# Patient Record
Sex: Female | Born: 1985 | Race: Black or African American | Hispanic: No | Marital: Married | Smoking: Never smoker
Health system: Southern US, Community
[De-identification: ages and names within clinical notes are randomized; demographics above are authoritative.]

## PROBLEM LIST (undated history)

## (undated) DIAGNOSIS — J45909 Unspecified asthma, uncomplicated: Secondary | ICD-10-CM

## (undated) HISTORY — PX: NO PAST SURGERIES: SHX2092

## (undated) HISTORY — DX: Unspecified asthma, uncomplicated: J45.909

---

## 2015-06-04 ENCOUNTER — Encounter: Payer: Self-pay | Admitting: Primary Care

## 2015-06-04 ENCOUNTER — Ambulatory Visit (INDEPENDENT_AMBULATORY_CARE_PROVIDER_SITE_OTHER): Payer: BLUE CROSS/BLUE SHIELD | Admitting: Primary Care

## 2015-06-04 ENCOUNTER — Encounter: Payer: Self-pay | Admitting: *Deleted

## 2015-06-04 VITALS — BP 122/76 | HR 81 | Temp 98.2°F | Ht 64.0 in | Wt 171.0 lb

## 2015-06-04 DIAGNOSIS — Z23 Encounter for immunization: Secondary | ICD-10-CM | POA: Diagnosis not present

## 2015-06-04 DIAGNOSIS — Z0000 Encounter for general adult medical examination without abnormal findings: Secondary | ICD-10-CM | POA: Diagnosis not present

## 2015-06-04 LAB — CBC
HCT: 38.8 % (ref 36.0–46.0)
Hemoglobin: 12.8 g/dL (ref 12.0–15.0)
MCHC: 32.9 g/dL (ref 30.0–36.0)
MCV: 84.9 fl (ref 78.0–100.0)
PLATELETS: 291 10*3/uL (ref 150.0–400.0)
RBC: 4.57 Mil/uL (ref 3.87–5.11)
RDW: 13.9 % (ref 11.5–15.5)
WBC: 4.8 10*3/uL (ref 4.0–10.5)

## 2015-06-04 LAB — COMPREHENSIVE METABOLIC PANEL
ALT: 28 U/L (ref 0–35)
AST: 25 U/L (ref 0–37)
Albumin: 4.2 g/dL (ref 3.5–5.2)
Alkaline Phosphatase: 60 U/L (ref 39–117)
BUN: 13 mg/dL (ref 6–23)
CALCIUM: 9.5 mg/dL (ref 8.4–10.5)
CHLORIDE: 104 meq/L (ref 96–112)
CO2: 29 meq/L (ref 19–32)
CREATININE: 0.73 mg/dL (ref 0.40–1.20)
GFR: 121.77 mL/min (ref >60.00)
Glucose, Bld: 95 mg/dL (ref 70–99)
POTASSIUM: 4.4 meq/L (ref 3.5–5.1)
Sodium: 139 mEq/L (ref 135–145)
Total Bilirubin: 0.4 mg/dL (ref 0.2–1.2)
Total Protein: 7.3 g/dL (ref 6.0–8.3)

## 2015-06-04 LAB — LIPID PANEL
CHOLESTEROL: 198 mg/dL (ref 0–200)
HDL: 44.8 mg/dL (ref >39.00)
LDL CALC: 140 mg/dL — AB (ref 0–99)
NonHDL: 153.34
TRIGLYCERIDES: 65 mg/dL (ref 0.0–149.0)
Total CHOL/HDL Ratio: 4
VLDL: 13 mg/dL (ref 0.0–40.0)

## 2015-06-04 LAB — TSH: TSH: 1.06 u[IU]/mL (ref 0.35–4.50)

## 2015-06-04 LAB — HEMOGLOBIN A1C: Hgb A1c MFr Bld: 5.5 % (ref 4.6–6.5)

## 2015-06-04 NOTE — Assessment & Plan Note (Signed)
Td in 2007, provided today.  Pap due in 2018. Exam unremarkable. Labs pending. Discussed the importance of a healthy diet and regular exercise in order for weight loss and to reduce risk of other medical diseases.  Follow up in 1 year for repeat physical.

## 2015-06-04 NOTE — Progress Notes (Signed)
Pre visit review using our clinic review tool, if applicable. No additional management support is needed unless otherwise documented below in the visit note. 

## 2015-06-04 NOTE — Patient Instructions (Signed)
Complete lab work prior to leaving today. I will notify you of your results once received.   It is important that you improve your diet. Please limit carbohydrates in the form of white bread, rice, pasta, sugary drinks, etc. Increase your consumption of fresh fruits and vegetables, whole grains, water.  Ensure you are consuming 64 ounces of water daily.  Start exercising. You should be getting 1 hour of moderate intensity exercise 5 days weekly.  You will be due for your pap in August 2018.  Start taking Prenatal Vitamins to help prepare your body for pregnancy.  Follow up in 1 year for repeat physical with Pap, or sooner if needed.  It was a pleasure to meet you today! Please don't hesitate to call me with any questions. Welcome to [facility]!

## 2015-06-04 NOTE — Progress Notes (Signed)
Subjective:    Patient ID: Sabrina Mills, female    DOB: 04/07/1985, 30 y.o.   MRN: [id number]  HPI  Mr. Cranford is a 30 year old female who presents today to establish care and discuss the problems mentioned below. Will obtain old records.  who presents today for complete physical.  Immunizations: -Tetanus: Completed in 2007.  -Influenza: Did complete last season.   Diet: She endorses a healthy diet. Breakfast: Oatmeal, coffee Lunch: Salad, burger Dinner: Pasta, salad, chicken, vegetables Snacks: Occasionally. Candy, chips, fruit, yogurt. Desserts: Occasionally. Beverages: Water, sweet tea, some sodas  Exercise: She does not currently exercise. Eye exam: Completed in 2014, no changes in vision. Dental exam: Has not completed in several years. Pap Smear: Completed in August 2015, normal.    Review of Systems  Constitutional: Negative for unexpected weight change.  HENT: Negative for rhinorrhea.   Respiratory: Negative for cough and shortness of breath.   Cardiovascular: Negative for chest pain.  Gastrointestinal: Negative for diarrhea and constipation.  Genitourinary: Negative for difficulty urinating and menstrual problem.       Regular periods  Musculoskeletal: Negative for myalgias and arthralgias.  Skin: Negative for rash.  Allergic/Immunologic: Negative for environmental allergies.  Neurological: Negative for dizziness, numbness and headaches.  Psychiatric/Behavioral:       Denies concerns for anxiety and depression       Past Medical History  Diagnosis Date  . Asthma      Social History   Social History  . Marital Status: Married    Spouse Name: N/A  . Number of Children: N/A  . Years of Education: N/A   Occupational History  . Not on file.   Social History Main Topics  . Smoking status: Never Smoker   . Smokeless tobacco: Not on file  . Alcohol Use: No  . Drug Use: No  . Sexual Activity: Not on file   Other Topics Concern  . Not on file    Social History Narrative   Married.   Moved from [place].   Works at [employer].   No children.   Enjoys traveling, cooking, spending time with family.    No past surgical history on file.  Family History  Problem Relation Age of Onset  . Hypertension Father     No Known Allergies  No current outpatient prescriptions on file prior to visit.   No current facility-administered medications on file prior to visit.    BP 122/76 mmHg  Pulse 81  Temp(Src) 98.2 F (36.8 C) (Oral)  Ht  (1.626 m)  Wt 171 lb (77.565 kg)  BMI 29.34 kg/m2  SpO2 98%  LMP 05/29/2015    Objective:   Physical Exam  Constitutional: She is oriented to person, place, and time. She appears well-nourished.  HENT:  Right Ear: Tympanic membrane and ear canal normal.  Left Ear: Tympanic membrane and ear canal normal.  Nose: Nose normal.  Mouth/Throat: Oropharynx is clear and moist.  Eyes: Conjunctivae and EOM are normal. Pupils are equal, round, and reactive to light.  Neck: Neck supple. No thyromegaly present.  Cardiovascular: Normal rate and regular rhythm.   No murmur heard. Pulmonary/Chest: Effort normal and breath sounds normal. She has no rales.  Abdominal: Soft. Bowel sounds are normal. There is no tenderness.  Musculoskeletal: Normal range of motion.  Lymphadenopathy:    She has no cervical adenopathy.  Neurological: She is alert and oriented to person, place, and time. She has normal reflexes. No cranial  nerve deficit.  Skin: Skin is warm and dry. No rash noted.  Psychiatric: She has a normal mood and affect.          Assessment & Plan:

## 2015-06-04 NOTE — Addendum Note (Signed)
Addended by: Tawnya CrookSAMBATH, Bernon Arviso on: 06/04/2015 09:38 AM   Modules accepted: Orders

## 2015-08-31 LAB — OB RESULTS CONSOLE GC/CHLAMYDIA
Chlamydia: NEGATIVE
Gonorrhea: NEGATIVE

## 2015-08-31 LAB — OB RESULTS CONSOLE RUBELLA ANTIBODY, IGM: Rubella: NON-IMMUNE/NOT IMMUNE

## 2015-08-31 LAB — OB RESULTS CONSOLE ANTIBODY SCREEN: Antibody Screen: NEGATIVE

## 2015-08-31 LAB — OB RESULTS CONSOLE ABO/RH: RH TYPE: POSITIVE

## 2015-08-31 LAB — OB RESULTS CONSOLE HIV ANTIBODY (ROUTINE TESTING): HIV: NONREACTIVE

## 2015-08-31 LAB — OB RESULTS CONSOLE HEPATITIS B SURFACE ANTIGEN: Hepatitis B Surface Ag: NEGATIVE

## 2015-08-31 LAB — OB RESULTS CONSOLE RPR: RPR: NONREACTIVE

## 2015-11-25 DIAGNOSIS — Z3A18 18 weeks gestation of pregnancy: Secondary | ICD-10-CM | POA: Diagnosis not present

## 2015-11-25 DIAGNOSIS — Z369 Encounter for antenatal screening, unspecified: Secondary | ICD-10-CM | POA: Diagnosis not present

## 2015-11-25 DIAGNOSIS — N898 Other specified noninflammatory disorders of vagina: Secondary | ICD-10-CM | POA: Diagnosis not present

## 2015-11-25 DIAGNOSIS — Z3491 Encounter for supervision of normal pregnancy, unspecified, first trimester: Secondary | ICD-10-CM | POA: Diagnosis not present

## 2015-12-10 DIAGNOSIS — Z363 Encounter for antenatal screening for malformations: Secondary | ICD-10-CM | POA: Diagnosis not present

## 2015-12-10 DIAGNOSIS — Z3A20 20 weeks gestation of pregnancy: Secondary | ICD-10-CM | POA: Diagnosis not present

## 2015-12-10 DIAGNOSIS — Z3402 Encounter for supervision of normal first pregnancy, second trimester: Secondary | ICD-10-CM | POA: Diagnosis not present

## 2015-12-10 DIAGNOSIS — O3692X1 Maternal care for fetal problem, unspecified, second trimester, fetus 1: Secondary | ICD-10-CM | POA: Diagnosis not present

## 2016-01-07 DIAGNOSIS — Z3A24 24 weeks gestation of pregnancy: Secondary | ICD-10-CM | POA: Diagnosis not present

## 2016-01-07 DIAGNOSIS — O3692X1 Maternal care for fetal problem, unspecified, second trimester, fetus 1: Secondary | ICD-10-CM | POA: Diagnosis not present

## 2016-01-07 DIAGNOSIS — Z3402 Encounter for supervision of normal first pregnancy, second trimester: Secondary | ICD-10-CM | POA: Diagnosis not present

## 2016-01-28 DIAGNOSIS — Z369 Encounter for antenatal screening, unspecified: Secondary | ICD-10-CM | POA: Diagnosis not present

## 2016-02-11 DIAGNOSIS — Z3403 Encounter for supervision of normal first pregnancy, third trimester: Secondary | ICD-10-CM | POA: Diagnosis not present

## 2016-02-11 DIAGNOSIS — Z3A29 29 weeks gestation of pregnancy: Secondary | ICD-10-CM | POA: Diagnosis not present

## 2016-02-23 DIAGNOSIS — Z3493 Encounter for supervision of normal pregnancy, unspecified, third trimester: Secondary | ICD-10-CM | POA: Diagnosis not present

## 2016-02-23 DIAGNOSIS — Z3A30 30 weeks gestation of pregnancy: Secondary | ICD-10-CM | POA: Diagnosis not present

## 2016-03-07 DIAGNOSIS — Z3493 Encounter for supervision of normal pregnancy, unspecified, third trimester: Secondary | ICD-10-CM | POA: Diagnosis not present

## 2016-03-07 DIAGNOSIS — Z3A32 32 weeks gestation of pregnancy: Secondary | ICD-10-CM | POA: Diagnosis not present

## 2016-03-29 DIAGNOSIS — Z3A35 35 weeks gestation of pregnancy: Secondary | ICD-10-CM | POA: Diagnosis not present

## 2016-03-29 DIAGNOSIS — Z3493 Encounter for supervision of normal pregnancy, unspecified, third trimester: Secondary | ICD-10-CM | POA: Diagnosis not present

## 2016-03-29 DIAGNOSIS — Z369 Encounter for antenatal screening, unspecified: Secondary | ICD-10-CM | POA: Diagnosis not present

## 2016-03-29 LAB — OB RESULTS CONSOLE GBS: STREP GROUP B AG: POSITIVE

## 2016-04-15 DIAGNOSIS — Z3403 Encounter for supervision of normal first pregnancy, third trimester: Secondary | ICD-10-CM | POA: Diagnosis not present

## 2016-04-15 DIAGNOSIS — Z3A38 38 weeks gestation of pregnancy: Secondary | ICD-10-CM | POA: Diagnosis not present

## 2016-04-18 DIAGNOSIS — Z3A38 38 weeks gestation of pregnancy: Secondary | ICD-10-CM | POA: Diagnosis not present

## 2016-04-18 DIAGNOSIS — Z3493 Encounter for supervision of normal pregnancy, unspecified, third trimester: Secondary | ICD-10-CM | POA: Diagnosis not present

## 2016-04-18 DIAGNOSIS — R102 Pelvic and perineal pain: Secondary | ICD-10-CM | POA: Diagnosis not present

## 2016-04-27 DIAGNOSIS — Z3493 Encounter for supervision of normal pregnancy, unspecified, third trimester: Secondary | ICD-10-CM | POA: Diagnosis not present

## 2016-04-27 DIAGNOSIS — Z3A40 40 weeks gestation of pregnancy: Secondary | ICD-10-CM | POA: Diagnosis not present

## 2016-04-28 ENCOUNTER — Encounter (HOSPITAL_COMMUNITY): Payer: Self-pay | Admitting: *Deleted

## 2016-04-28 ENCOUNTER — Telehealth (HOSPITAL_COMMUNITY): Payer: Self-pay | Admitting: *Deleted

## 2016-04-28 NOTE — Telephone Encounter (Signed)
Preadmission screen  

## 2016-04-29 DIAGNOSIS — O480 Post-term pregnancy: Secondary | ICD-10-CM | POA: Diagnosis not present

## 2016-04-29 DIAGNOSIS — Z3A40 40 weeks gestation of pregnancy: Secondary | ICD-10-CM | POA: Diagnosis not present

## 2016-05-01 ENCOUNTER — Encounter (HOSPITAL_COMMUNITY): Payer: Self-pay | Admitting: *Deleted

## 2016-05-01 ENCOUNTER — Inpatient Hospital Stay (HOSPITAL_COMMUNITY): Payer: BLUE CROSS/BLUE SHIELD | Admitting: Anesthesiology

## 2016-05-01 ENCOUNTER — Encounter (HOSPITAL_COMMUNITY)
Admission: AD | Disposition: A | Discharge status: Home / Self Care | Payer: Self-pay | Source: Ambulatory Visit | Attending: Obstetrics and Gynecology

## 2016-05-01 ENCOUNTER — Inpatient Hospital Stay (HOSPITAL_COMMUNITY)
Admission: AD | Admit: 2016-05-01 | Discharge: 2016-05-04 | DRG: 775 | Disposition: A | Discharge status: Home / Self Care | Payer: BLUE CROSS/BLUE SHIELD | Source: Ambulatory Visit | Attending: Obstetrics and Gynecology | Admitting: Obstetrics and Gynecology

## 2016-05-01 DIAGNOSIS — O9902 Anemia complicating childbirth: Secondary | ICD-10-CM | POA: Diagnosis not present

## 2016-05-01 DIAGNOSIS — Z8249 Family history of ischemic heart disease and other diseases of the circulatory system: Secondary | ICD-10-CM | POA: Diagnosis not present

## 2016-05-01 DIAGNOSIS — D573 Sickle-cell trait: Secondary | ICD-10-CM | POA: Diagnosis not present

## 2016-05-01 DIAGNOSIS — Z3A40 40 weeks gestation of pregnancy: Secondary | ICD-10-CM | POA: Diagnosis not present

## 2016-05-01 DIAGNOSIS — Z6836 Body mass index (BMI) 36.0-36.9, adult: Secondary | ICD-10-CM

## 2016-05-01 DIAGNOSIS — O99214 Obesity complicating childbirth: Secondary | ICD-10-CM | POA: Diagnosis present

## 2016-05-01 DIAGNOSIS — O139 Gestational [pregnancy-induced] hypertension without significant proteinuria, unspecified trimester: Secondary | ICD-10-CM

## 2016-05-01 DIAGNOSIS — Z3493 Encounter for supervision of normal pregnancy, unspecified, third trimester: Secondary | ICD-10-CM | POA: Diagnosis not present

## 2016-05-01 DIAGNOSIS — Z98891 History of uterine scar from previous surgery: Secondary | ICD-10-CM

## 2016-05-01 DIAGNOSIS — Z23 Encounter for immunization: Secondary | ICD-10-CM | POA: Diagnosis not present

## 2016-05-01 DIAGNOSIS — O99824 Streptococcus B carrier state complicating childbirth: Secondary | ICD-10-CM | POA: Diagnosis present

## 2016-05-01 DIAGNOSIS — O134 Gestational [pregnancy-induced] hypertension without significant proteinuria, complicating childbirth: Secondary | ICD-10-CM | POA: Diagnosis present

## 2016-05-01 DIAGNOSIS — I10 Essential (primary) hypertension: Secondary | ICD-10-CM | POA: Diagnosis not present

## 2016-05-01 LAB — COMPREHENSIVE METABOLIC PANEL
ALT: 11 U/L — AB (ref 14–54)
ANION GAP: 9 (ref 5–15)
AST: 19 U/L (ref 15–41)
Albumin: 3.1 g/dL — ABNORMAL LOW (ref 3.5–5.0)
Alkaline Phosphatase: 158 U/L — ABNORMAL HIGH (ref 38–126)
BUN: 8 mg/dL (ref 6–20)
CALCIUM: 9.1 mg/dL (ref 8.9–10.3)
CHLORIDE: 104 mmol/L (ref 101–111)
CO2: 21 mmol/L — ABNORMAL LOW (ref 22–32)
CREATININE: 0.66 mg/dL (ref 0.44–1.00)
Glucose, Bld: 88 mg/dL (ref 65–99)
Potassium: 4.1 mmol/L (ref 3.5–5.1)
SODIUM: 134 mmol/L — AB (ref 135–145)
Total Bilirubin: 0.8 mg/dL (ref 0.3–1.2)
Total Protein: 6.6 g/dL (ref 6.5–8.1)

## 2016-05-01 LAB — LACTATE DEHYDROGENASE: LDH: 194 U/L — ABNORMAL HIGH (ref 98–192)

## 2016-05-01 LAB — TYPE AND SCREEN
ABO/RH(D): O POS
Antibody Screen: NEGATIVE

## 2016-05-01 LAB — CBC
HCT: 38.6 % (ref 36.0–46.0)
Hemoglobin: 13.5 g/dL (ref 12.0–15.0)
MCH: 29.2 pg (ref 26.0–34.0)
MCHC: 35 g/dL (ref 30.0–36.0)
MCV: 83.5 fL (ref 78.0–100.0)
PLATELETS: 277 10*3/uL (ref 150–400)
RBC: 4.62 MIL/uL (ref 3.87–5.11)
RDW: 15 % (ref 11.5–15.5)
WBC: 8.6 10*3/uL (ref 4.0–10.5)

## 2016-05-01 LAB — URIC ACID: Uric Acid, Serum: 4.4 mg/dL (ref 2.3–6.6)

## 2016-05-01 LAB — PROTEIN / CREATININE RATIO, URINE
CREATININE, URINE: 129 mg/dL
PROTEIN CREATININE RATIO: 0.28 mg/mg{creat} — AB (ref 0.00–0.15)
TOTAL PROTEIN, URINE: 36 mg/dL

## 2016-05-01 LAB — ABO/RH: ABO/RH(D): O POS

## 2016-05-01 SURGERY — Surgical Case
Anesthesia: Epidural

## 2016-05-01 MED ORDER — OXYCODONE-ACETAMINOPHEN 5-325 MG PO TABS: 1.0000 | ORAL_TABLET | ORAL | Status: DC | PRN

## 2016-05-01 MED ORDER — ONDANSETRON HCL 4 MG/2ML IJ SOLN
INTRAMUSCULAR | Status: DC | PRN
  Administered 2016-05-01: 4 mg via INTRAVENOUS

## 2016-05-01 MED ORDER — MORPHINE SULFATE (PF) 0.5 MG/ML IJ SOLN
INTRAMUSCULAR | Status: DC | PRN
  Administered 2016-05-01: .5 mg via INTRAVENOUS
  Administered 2016-05-01: .5 mg via EPIDURAL
  Administered 2016-05-01: 4 mg via EPIDURAL

## 2016-05-01 MED ORDER — SODIUM CHLORIDE 0.9 % IJ SOLN
INTRAMUSCULAR | Status: AC
  Filled 2016-05-01: qty 20

## 2016-05-01 MED ORDER — ONDANSETRON HCL 4 MG/2ML IJ SOLN
INTRAMUSCULAR | Status: AC
  Filled 2016-05-01: qty 4

## 2016-05-01 MED ORDER — PHENYLEPHRINE 40 MCG/ML (10ML) SYRINGE FOR IV PUSH (FOR BLOOD PRESSURE SUPPORT)
80.0000 ug | PREFILLED_SYRINGE | INTRAVENOUS | Status: DC | PRN
  Filled 2016-05-01: qty 10

## 2016-05-01 MED ORDER — PENICILLIN G POT IN DEXTROSE 60000 UNIT/ML IV SOLN
3.0000 10*6.[IU] | INTRAVENOUS | Status: DC
  Administered 2016-05-01 (×2): 3 10*6.[IU] via INTRAVENOUS
  Filled 2016-05-01 (×3): qty 50

## 2016-05-01 MED ORDER — OXYCODONE-ACETAMINOPHEN 5-325 MG PO TABS: 2.0000 | ORAL_TABLET | ORAL | Status: DC | PRN

## 2016-05-01 MED ORDER — SCOPOLAMINE 1 MG/3DAYS TD PT72
MEDICATED_PATCH | TRANSDERMAL | Status: AC
  Filled 2016-05-01: qty 1

## 2016-05-01 MED ORDER — MORPHINE SULFATE (PF) 0.5 MG/ML IJ SOLN
INTRAMUSCULAR | Status: AC
Start: 2016-05-01 — End: 2016-05-01
  Filled 2016-05-01: qty 10

## 2016-05-01 MED ORDER — LABETALOL HCL 5 MG/ML IV SOLN: 20.0000 mg | INTRAVENOUS | Status: DC | PRN

## 2016-05-01 MED ORDER — LACTATED RINGERS IV SOLN
INTRAVENOUS | Status: DC
  Administered 2016-05-01: 16:00:00 via INTRAUTERINE

## 2016-05-01 MED ORDER — OXYTOCIN 40 UNITS IN LACTATED RINGERS INFUSION - SIMPLE MED
2.5000 [IU]/h | INTRAVENOUS | Status: DC
  Filled 2016-05-01: qty 1000

## 2016-05-01 MED ORDER — LACTATED RINGERS IV SOLN
500.0000 mL | Freq: Once | INTRAVENOUS | Status: AC
  Administered 2016-05-01: 500 mL via INTRAVENOUS

## 2016-05-01 MED ORDER — OXYTOCIN 40 UNITS IN LACTATED RINGERS INFUSION - SIMPLE MED
1.0000 m[IU]/min | INTRAVENOUS | Status: DC
  Administered 2016-05-01: 2 m[IU]/min via INTRAVENOUS
  Administered 2016-05-01: 6 m[IU]/min via INTRAVENOUS

## 2016-05-01 MED ORDER — PHENYLEPHRINE 40 MCG/ML (10ML) SYRINGE FOR IV PUSH (FOR BLOOD PRESSURE SUPPORT)
PREFILLED_SYRINGE | INTRAVENOUS | Status: AC
  Filled 2016-05-01: qty 10

## 2016-05-01 MED ORDER — MEPERIDINE HCL 25 MG/ML IJ SOLN
INTRAMUSCULAR | Status: AC
  Filled 2016-05-01: qty 1

## 2016-05-01 MED ORDER — ACETAMINOPHEN 325 MG PO TABS: 650.0000 mg | ORAL_TABLET | ORAL | Status: DC | PRN

## 2016-05-01 MED ORDER — LIDOCAINE-EPINEPHRINE (PF) 2 %-1:200000 IJ SOLN
INTRAMUSCULAR | Status: AC
  Filled 2016-05-01: qty 20

## 2016-05-01 MED ORDER — SOD CITRATE-CITRIC ACID 500-334 MG/5ML PO SOLN
30.0000 mL | ORAL | Status: DC | PRN
Start: 2016-05-01 — End: 2016-05-02
  Administered 2016-05-01: 30 mL via ORAL
  Filled 2016-05-01: qty 15

## 2016-05-01 MED ORDER — HYDRALAZINE HCL 20 MG/ML IJ SOLN: 10.0000 mg | Freq: Once | INTRAMUSCULAR | Status: DC | PRN

## 2016-05-01 MED ORDER — EPHEDRINE 5 MG/ML INJ: 10.0000 mg | INTRAVENOUS | Status: DC | PRN

## 2016-05-01 MED ORDER — OXYTOCIN 10 UNIT/ML IJ SOLN
INTRAMUSCULAR | Status: AC
  Filled 2016-05-01: qty 4

## 2016-05-01 MED ORDER — DIPHENHYDRAMINE HCL 50 MG/ML IJ SOLN: 12.5000 mg | INTRAMUSCULAR | Status: DC | PRN

## 2016-05-01 MED ORDER — SODIUM BICARBONATE 8.4 % IV SOLN
INTRAVENOUS | Status: DC | PRN
  Administered 2016-05-01 (×2): 5 mL via EPIDURAL

## 2016-05-01 MED ORDER — LIDOCAINE HCL (PF) 1 % IJ SOLN
INTRAMUSCULAR | Status: DC | PRN
  Administered 2016-05-01 (×2): 5 mL via EPIDURAL

## 2016-05-01 MED ORDER — ONDANSETRON HCL 4 MG/2ML IJ SOLN: 4.0000 mg | Freq: Four times a day (QID) | INTRAMUSCULAR | Status: DC | PRN

## 2016-05-01 MED ORDER — MEPERIDINE HCL 25 MG/ML IJ SOLN
INTRAMUSCULAR | Status: DC | PRN
  Administered 2016-05-01 (×2): 12.5 mg via INTRAVENOUS

## 2016-05-01 MED ORDER — LIDOCAINE HCL (PF) 1 % IJ SOLN: 30.0000 mL | INTRAMUSCULAR | Status: DC | PRN

## 2016-05-01 MED ORDER — LACTATED RINGERS IV SOLN: 500.0000 mL | INTRAVENOUS | Status: DC | PRN

## 2016-05-01 MED ORDER — OXYTOCIN BOLUS FROM INFUSION: 500.0000 mL | Freq: Once | INTRAVENOUS | Status: DC

## 2016-05-01 MED ORDER — LACTATED RINGERS IV SOLN
INTRAVENOUS | Status: DC
  Administered 2016-05-01 (×2): via INTRAVENOUS

## 2016-05-01 MED ORDER — CEFAZOLIN SODIUM-DEXTROSE 2-4 GM/100ML-% IV SOLN: 2.0000 g | Freq: Once | INTRAVENOUS | Status: DC

## 2016-05-01 MED ORDER — PHENYLEPHRINE 40 MCG/ML (10ML) SYRINGE FOR IV PUSH (FOR BLOOD PRESSURE SUPPORT)
80.0000 ug | PREFILLED_SYRINGE | INTRAVENOUS | Status: DC | PRN
  Administered 2016-05-01: 80 ug via INTRAVENOUS

## 2016-05-01 MED ORDER — FENTANYL 2.5 MCG/ML BUPIVACAINE 1/10 % EPIDURAL INFUSION (WH - ANES)
14.0000 mL/h | INTRAMUSCULAR | Status: DC | PRN
  Administered 2016-05-01: 14 mL/h via EPIDURAL
  Filled 2016-05-01: qty 100

## 2016-05-01 MED ORDER — CEFAZOLIN SODIUM-DEXTROSE 2-3 GM-% IV SOLR
INTRAVENOUS | Status: DC | PRN
  Administered 2016-05-01: 2 g via INTRAVENOUS

## 2016-05-01 MED ORDER — SCOPOLAMINE 1 MG/3DAYS TD PT72
MEDICATED_PATCH | TRANSDERMAL | Status: DC | PRN
  Administered 2016-05-01: 1 via TRANSDERMAL

## 2016-05-01 MED ORDER — PENICILLIN G POTASSIUM 5000000 UNITS IJ SOLR
5.0000 10*6.[IU] | Freq: Once | INTRAVENOUS | Status: AC
  Administered 2016-05-01: 5 10*6.[IU] via INTRAVENOUS
  Filled 2016-05-01: qty 5

## 2016-05-01 MED ORDER — OXYTOCIN 10 UNIT/ML IJ SOLN
INTRAVENOUS | Status: DC | PRN
  Administered 2016-05-01: 40 [IU] via INTRAVENOUS

## 2016-05-01 MED ORDER — TERBUTALINE SULFATE 1 MG/ML IJ SOLN: 0.2500 mg | Freq: Once | INTRAMUSCULAR | Status: DC | PRN

## 2016-05-01 SURGICAL SUPPLY — 38 items
BARRIER ADHS 3X4 INTERCEED (GAUZE/BANDAGES/DRESSINGS) ×2 IMPLANT
BENZOIN TINCTURE PRP APPL 2/3 (GAUZE/BANDAGES/DRESSINGS) ×2 IMPLANT
CHLORAPREP W/TINT 26ML (MISCELLANEOUS) ×2 IMPLANT
CLAMP CORD UMBIL (MISCELLANEOUS) IMPLANT
CLOSURE STERI STRIP 1/2 X4 (GAUZE/BANDAGES/DRESSINGS) ×2 IMPLANT
CLOTH BEACON ORANGE TIMEOUT ST (SAFETY) ×2 IMPLANT
CONTAINER PREFILL 10% NBF 15ML (MISCELLANEOUS) IMPLANT
DRSG OPSITE POSTOP 4X10 (GAUZE/BANDAGES/DRESSINGS) ×2 IMPLANT
ELECT REM PT RETURN 9FT ADLT (ELECTROSURGICAL) ×2
ELECTRODE REM PT RTRN 9FT ADLT (ELECTROSURGICAL) ×1 IMPLANT
EXTRACTOR VACUUM KIWI (MISCELLANEOUS) IMPLANT
GLOVE BIOGEL M 6.5 STRL (GLOVE) ×4 IMPLANT
GLOVE BIOGEL PI IND STRL 6.5 (GLOVE) ×1 IMPLANT
GLOVE BIOGEL PI IND STRL 7.0 (GLOVE) ×1 IMPLANT
GLOVE BIOGEL PI INDICATOR 6.5 (GLOVE) ×1
GLOVE BIOGEL PI INDICATOR 7.0 (GLOVE) ×1
GOWN STRL REUS W/TWL LRG LVL3 (GOWN DISPOSABLE) ×6 IMPLANT
KIT ABG SYR 3ML LUER SLIP (SYRINGE) IMPLANT
NEEDLE HYPO 25X5/8 SAFETYGLIDE (NEEDLE) IMPLANT
NS IRRIG 1000ML POUR BTL (IV SOLUTION) ×2 IMPLANT
PACK C SECTION WH (CUSTOM PROCEDURE TRAY) ×2 IMPLANT
PAD ABD 7.5X8 STRL (GAUZE/BANDAGES/DRESSINGS) ×2 IMPLANT
PAD OB MATERNITY 4.3X12.25 (PERSONAL CARE ITEMS) ×2 IMPLANT
PENCIL SMOKE EVAC W/HOLSTER (ELECTROSURGICAL) ×2 IMPLANT
RTRCTR C-SECT PINK 25CM LRG (MISCELLANEOUS) IMPLANT
STRIP CLOSURE SKIN 1/2X4 (GAUZE/BANDAGES/DRESSINGS) ×2 IMPLANT
SUT MNCRL AB 3-0 PS2 27 (SUTURE) ×2 IMPLANT
SUT PDS AB 0 CT1 27 (SUTURE) ×4 IMPLANT
SUT PLAIN 0 NONE (SUTURE) IMPLANT
SUT VIC AB 0 CTX 36 (SUTURE) ×3
SUT VIC AB 0 CTX36XBRD ANBCTRL (SUTURE) ×3 IMPLANT
SUT VIC AB 2-0 CT1 27 (SUTURE) ×1
SUT VIC AB 2-0 CT1 TAPERPNT 27 (SUTURE) ×1 IMPLANT
SUT VIC AB 3-0 SH 27 (SUTURE)
SUT VIC AB 3-0 SH 27X BRD (SUTURE) IMPLANT
SUT VIC AB 4-0 KS 27 (SUTURE) ×2 IMPLANT
TOWEL OR 17X24 6PK STRL BLUE (TOWEL DISPOSABLE) ×2 IMPLANT
TRAY FOLEY BAG SILVER LF 14FR (SET/KITS/TRAYS/PACK) ×2 IMPLANT

## 2016-05-01 NOTE — Transfer of Care (Signed)
Immediate Anesthesia Transfer of Care Note  Patient: Sabrina Mills  Procedure(s) Performed: Procedure(s): CESAREAN SECTION (N/A)  Patient Location: PACU  Anesthesia Type:Epidural  Level of Consciousness: awake, alert  and oriented  Airway & Oxygen Therapy: Patient Spontanous Breathing  Post-op Assessment: Report given to RN and Post -op Vital signs reviewed and stable  Post vital signs: Reviewed and stable  Last Vitals:  Vitals:   05/01/16 2001 05/01/16 2031  BP: 133/70 (!) 145/93  Pulse: (!) 112 (!) 111  Resp: 18 18  Temp: 37.4 C     Last Pain:  Vitals:   05/01/16 2001  TempSrc: Axillary  PainSc: 0-No pain         Complications: No apparent anesthesia complications

## 2016-05-01 NOTE — MAU Note (Signed)
Pt states she has been contracting since 0845 and they have gotten stronger and closer together, denies LOF/VB.

## 2016-05-01 NOTE — Progress Notes (Signed)
Patient with recurrent late decelerations. Recommend cesarean section. r/b/a reviewed with patient. She agrees to proceed with cesarean section due to fetal intolerance of labor.

## 2016-05-01 NOTE — Anesthesia Preprocedure Evaluation (Signed)
Anesthesia Evaluation  Patient identified by MRN, date of birth, ID band Patient awake    Reviewed: Allergy & Precautions, H&P , NPO status , Patient's Chart, lab work & pertinent test results, reviewed documented beta blocker date and time   Airway Mallampati: II  TM Distance: >3 FB Neck ROM: full    Dental no notable dental hx. (+) Teeth Intact, Dental Advidsory Given   Pulmonary asthma ,    Pulmonary exam normal breath sounds clear to auscultation       Cardiovascular Exercise Tolerance: Good hypertension,  Rhythm:regular Rate:Normal     Neuro/Psych negative neurological ROS  negative psych ROS   GI/Hepatic negative GI ROS, Neg liver ROS,   Endo/Other  Morbid obesity  Renal/GU negative Renal ROS  negative genitourinary   Musculoskeletal   Abdominal   Peds  Hematology negative hematology ROS (+)   Anesthesia Other Findings   Reproductive/Obstetrics negative OB ROS                             Anesthesia Physical Anesthesia Plan  ASA: II  Anesthesia Plan: Epidural   Post-op Pain Management:    Induction:   Airway Management Planned:   Additional Equipment:   Intra-op Plan:   Post-operative Plan:   Informed Consent: I have reviewed the patients History and Physical, chart, labs and discussed the procedure including the risks, benefits and alternatives for the proposed anesthesia with the patient or authorized representative who has indicated his/her understanding and acceptance.   Dental Advisory Given  Plan Discussed with: CRNA  Anesthesia Plan Comments:         Anesthesia Quick Evaluation

## 2016-05-01 NOTE — Progress Notes (Signed)
Subjective: Pt just finished epidural.  Feeling just pressure.  Called to room for fetal heart rate being low.   Objective: BP (!) 146/85   Pulse 88   Temp 98.3 F (36.8 C) (Oral)   Resp 18   Ht  (1.626 m)   Wt 95.7 kg (211 lb)   LMP 07/22/2015   BMI 36.22 kg/m  No intake/output data recorded. No intake/output data recorded.  FHT: Category 2 UC:   irregular, every 2-6 minutes SVE:   Dilation: 3 Effacement (%): 90 Station: -1 Exam by:: Prothero, cnm FHT down to 110s from bl of 150. Multiple long decels.  Scalp applied with out difficulty.  IUPC places.  Amnioinfusion started with 300 cc bolus than 125cc an hour.  Discussed procedures with pt and husband before placing.  Return to BL with position change. Assessment:  G1P0 at 40.4 IUP latent labor Cat 2 strip GBS positive  Plan: Update to Dr. Richardson Dopp Will monitor strip if reassuring will start pitocin augmentation for adequate contractions.  Kenney Houseman CNM, MSN 05/01/2016, 4:49 PM

## 2016-05-01 NOTE — Progress Notes (Signed)
In to assess patient due to reports of prolonged variable and late decelerations. Patient received iV fluid bolus/ Amnioinfusion and position changes prior to my arrival. Upon my arrival FHR baseline 140 with moderate variability accelerations present no decelerations. I discussed with the patient that the FHR was currently reassuring. Will start pitocin for augmentation given reassuring fhr. Will recommend cesarean section if pt has fetal intolerance of labor after pitocin is started. r/b/a of cesarean section were discussed including but not limited to infection, bleeding damage to bowel bladder baby with the need for further surgery. r/o transfusion discussed. Pt voiced understanding.

## 2016-05-01 NOTE — Op Note (Signed)
Cesarean Section Procedure Note  Indications: non-reassuring fetal status  Pre-operative Diagnosis: 40 week 4 day pregnancy.  Post-operative Diagnosis: same  Surgeon: Jessee Avers.   Assistants: Gerrit Heck CNM  Anesthesia: Epidural anesthesia  ASA Class: 2   Procedure Details   The patient was seen in the Holding Room. The risks, benefits, complications, treatment options, and expected outcomes were discussed with the patient.  The patient concurred with the proposed plan, giving informed consent.  The site of surgery properly noted/marked. The patient was taken to Operating Room # 9, identified as Sabrina Mills and the procedure verified as C-Section Delivery. A Time Out was held and the above information confirmed.  After induction of anesthesia, the patient was draped and prepped in the usual sterile manner. A Pfannenstiel incision was made and carried down through the subcutaneous tissue to the fascia. Fascial incision was made and extended transversely. The fascia was separated from the underlying rectus tissue superiorly and inferiorly. The peritoneum was identified and entered. Peritoneal incision was extended longitudinally. The utero-vesical peritoneal reflection was incised transversely and the bladder flap was bluntly freed from the lower uterine segment. A low transverse uterine incision was made. Delivered from cephalic presentation was a  Female with Apgar scores of 8 at one minute and 9 at five minutes. After the umbilical cord was clamped and cut cord blood was obtained for evaluation. The placenta was removed intact and appeared normal. The uterine outline, tubes and ovaries appeared normal. The uterine incision was closed with running locked sutures of O vicryl. A second layer of 0 vicryl was used to imbricate the incision. . Hemostasis was observed. Lavage was carried out until clear.  Peritoneum was closed with 2-0 vicryl The fascia was then reapproximated with running  sutures of 0 pds. The skin was reapproximated with 4-0 vicryl .  Instrument, sponge, and needle counts were correct prior the abdominal closure and at the conclusion of the case.   Findings: Female infant in cephalic occiput posterior presentation.normal fallopian tubes and ovaries   Estimated Blood Loss:  600 mL         Drains: None         Total IV Fluids:   Per anesthesia ml         Specimens: Placenta and Disposition:  Sent to Pathology          Implants: None UOP 200 cc          Complications:  None; patient tolerated the procedure well.         Disposition: PACU - hemodynamically stable.         Condition: stable  Attending Attestation: I performed the procedure.

## 2016-05-01 NOTE — Progress Notes (Signed)
Sabrina Mills MRN: [id number]  Subjective: -Care assumed of 31 y.o. G1P0 at [redacted]w[redacted]d who presents for early labor. Patient under care of [facility] and pregnancy remarkable for Rubella NI, [place] Trait, and GBS positive. Strip Reviewed and abnormal prolonged decelerations noted. In room to assess and meet acquaintance of patient and family.  Patient comfortable with epidural.   Objective: BP (!) 151/95   Pulse (!) 102   Temp 99.3 F (37.4 C) (Axillary)   Resp 18   Ht  (1.626 m)   Wt 95.7 kg (211 lb)   LMP 07/22/2015   BMI 36.22 kg/m  No intake/output data recorded. No intake/output data recorded.  Fetal Monitoring: FHT: 150 bpm, Mod Var, +Prolonged Decel, +Accels UC: Q3-32min,  MVUs    Physical Exam: General appearance: alert, well appearing, and in no distress. Chest: not examined.  Abdominal exam: not examined. Extremities: not examined Skin exam: Warm Dry  Vaginal Exam: SVE:   Dilation: 4 Effacement (%): 90 Station: -2 Exam by:: Jaksen Fiorella,CNM Membranes: AROM x 4hrs Internal Monitors: IUPC in place and FSE found to be dislodged-replaced w/o difficulty  Augmentation/Induction: Pitocin:106mUn/min  Cytotec: None  Assessment:  IUP at 40.4wks Cat II FT  Labor Augmentation  Plan: -FSE replaced and immediate improvement noted in FHT -Position to promote fetal descent and rotation -Pitocin titration as ordered -Dr. Delrae Sawyers updated on patient status -Continue other mgmt as ordered   Valma Cava, CNM 05/01/2016, 8:16 PM

## 2016-05-01 NOTE — Anesthesia Procedure Notes (Signed)
Epidural Patient location during procedure: OB Start time: 05/01/2016 3:26 PM End time: 05/01/2016 3:39 PM  Staffing Anesthesiologist: Heather Roberts Performed: anesthesiologist   Preanesthetic Checklist Completed: patient identified, site marked, pre-op evaluation, timeout performed, IV checked, risks and benefits discussed and monitors and equipment checked  Epidural Patient position: sitting Prep: DuraPrep Patient monitoring: heart rate, cardiac monitor, continuous pulse ox and blood pressure Approach: midline Location: L2-L3 Injection technique: LOR saline  Needle:  Needle type: Tuohy  Needle gauge: 17 G Needle length: 9 cm Needle insertion depth: 7 cm Catheter size: 20 Guage Catheter at skin depth: 11 cm Test dose: negative and Other  Assessment Events: blood not aspirated, injection not painful, no injection resistance and negative IV test  Additional Notes Informed consent obtained prior to proceeding including risk of failure, 1% risk of PDPH, risk of minor discomfort and bruising.  Discussed rare but serious complications including epidural abscess, permanent nerve injury, epidural hematoma.  Discussed alternatives to epidural analgesia and patient desires to proceed.  Timeout performed pre-procedure verifying patient name, procedure, and platelet count.  Patient tolerated procedure well.

## 2016-05-01 NOTE — MAU Note (Signed)
Urine in lab 

## 2016-05-01 NOTE — Consult Note (Addendum)
Neonatology Note:   Attendance at C-section:    I was asked by [facility] to attend this primary C/S at 40 4/7 weeks due to [facility]. The mother is a G1P0 O pos, Rubella NI, GBS positive with sickle trait and mild gestational hypertension. ROM 6 hours prior to delivery, fluid bloody. Mother got Pen G > 4 hours before delivery and was afebrile during labor. Infant vigorous with good spontaneous cry and tone. Cord clamping done at 30 seconds. Needed only minimal bulb suctioning. Ap 9/9. Lungs clear to ausc in DR. A large birthmark is noted on the right abdomen, demarcated along the midline anteriorly, extending around the flank onto the back; it is bluish/slightly darker than surrounding skin. Shown to parents. To CN to care of Pediatrician.   Doretha Sou, MD

## 2016-05-01 NOTE — H&P (Signed)
Sabrina Mills is a 31 y.o. female, G1P0 at 40.2 weeks, presenting for contractions since 0845 this am.  Denies leaking of fluid. FM+  BP increased on admission.  Denies headache or blurred vision.  Prenatal hx remarkable for Rubella non-immune, sickle cell trait positive, FOB not tested, GBS positive.  Patient Active Problem List   Diagnosis Date Noted  . Normal labor 05/01/2016  . Gestational hypertension affecting first pregnancy 05/01/2016  . Preventative health care 06/04/2015    History of present pregnancy: Patient entered care at 5.5 weeks.   EDC of 04/27/2016 was established by LMP.   Anatomy scan:  20 weeks, with normal findings and an posterior placenta.   Additional [place] evaluations:  Follow up for unseen anatomy at 24 weeks.     Last evaluation:  04/29/2016  OB History    Gravida Para Term Preterm AB Living   1             SAB TAB Ectopic Multiple Live Births                 Past Medical History:  Diagnosis Date  . Asthma    Past Surgical History:  Procedure Laterality Date  . NO PAST SURGERIES     Family History: family history includes Hypertension in her father. Social History:  reports that she has never smoked. She has never used smokeless tobacco. She reports that she does not drink alcohol or use drugs.   Prenatal Transfer Tool  Maternal Diabetes: No Genetic Screening: Normal Maternal Ultrasounds/Referrals: Normal Fetal Ultrasounds or other Referrals:  None Maternal Substance Abuse:  No Significant Maternal Medications:  None Significant Maternal Lab Results: Lab values include: Group B Strep positive, Other: SST positive, Rubella nonimmune  TDAP Yes Flu Yes  ROS:  All 10 systems reviewed and negative except as stated above  No Known Allergies   Dilation: 3 Effacement (%): 80 Station: -1 Exam by:: A. Gagliardo, RN Blood pressure (!) 152/96, pulse 84, resp. rate 16, last menstrual period 07/22/2015.  Chest clear Heart RRR without murmur Abd  gravid, NT, FH appropriate Pelvic: per RN above Ext: +2/+2  FHR: Category 1 UCs:  2-3 in 10 minutes  Prenatal labs: ABO, Rh: O/Positive/-- (08/28 0000) Antibody: Negative (08/28 0000) Rubella:  Non-immune RPR: Nonreactive (08/28 0000)  HBsAg: Negative (08/28 0000)  HIV: Non-reactive (08/28 0000)  GBS: Positive (03/27 0000) Sickle cell/Hgb electrophoresis:  Postive Pap:  09/2015 Negative GC:  Neg Chlamydia:  Neg Genetic screenings:  Neg Glucola:  116 Other:   Hgb 13.2 at NOB, 11.3 at 28 weeks       Assessment/Plan: IUP at 40.2 in active labor Cat 1 strip Gestational Hypertension GBS positive SST positive Rubella non-immune  Plan: Admit to Birthing Suite per consult with Dr. Richardson Dopp Routine [facility] orders Pain med/epidural prn PCN G for GBS prophylaxis  Pre eclampsia labs with PCR  Henderson Newcomer ProtheroCNM, MSN 05/01/2016, 11:15 AM    2

## 2016-05-01 NOTE — Anesthesia Pain Management Evaluation Note (Signed)
  CRNA Pain Management Visit Note  Patient: Sabrina Mills, 31 y.o., female  "Hello I am a member of the anesthesia team at [facility]. We have an anesthesia team available at all times to provide care throughout the hospital, including epidural management and anesthesia for C-section. I don't know your plan for the delivery whether it a natural birth, water birth, IV sedation, nitrous supplementation, doula or epidural, but we want to meet your pain goals."   1.Was your pain managed to your expectations on prior hospitalizations?   No prior hospitalizations  2.What is your expectation for pain management during this hospitalization?     Epidural  3.How can we help you reach that goal? Epidural in place at time of visit  Record the patient's initial score and the patient's pain goal.   Pain: 0  Pain Goal: 10 [facility] wants you to be able to say your pain was always managed very well.  Rica Records 05/01/2016

## 2016-05-02 ENCOUNTER — Encounter (HOSPITAL_COMMUNITY): Payer: Self-pay | Admitting: Obstetrics and Gynecology

## 2016-05-02 LAB — CBC
HEMATOCRIT: 34.1 % — AB (ref 36.0–46.0)
HEMOGLOBIN: 12 g/dL (ref 12.0–15.0)
MCH: 29.1 pg (ref 26.0–34.0)
MCHC: 35.2 g/dL (ref 30.0–36.0)
MCV: 82.6 fL (ref 78.0–100.0)
Platelets: 265 10*3/uL (ref 150–400)
RBC: 4.13 MIL/uL (ref 3.87–5.11)
RDW: 15.2 % (ref 11.5–15.5)
WBC: 11.8 10*3/uL — ABNORMAL HIGH (ref 4.0–10.5)

## 2016-05-02 LAB — RPR: RPR Ser Ql: NONREACTIVE

## 2016-05-02 MED ORDER — KETOROLAC TROMETHAMINE 30 MG/ML IJ SOLN
30.0000 mg | Freq: Once | INTRAMUSCULAR | Status: AC
Start: 2016-05-02 — End: 2016-05-02
  Administered 2016-05-02: 30 mg via INTRAMUSCULAR

## 2016-05-02 MED ORDER — PRENATAL MULTIVITAMIN CH
1.0000 | ORAL_TABLET | Freq: Every day | ORAL | Status: DC
  Administered 2016-05-02 – 2016-05-04 (×3): 1 via ORAL
  Filled 2016-05-02 (×3): qty 1

## 2016-05-02 MED ORDER — ZOLPIDEM TARTRATE 5 MG PO TABS: 5.0000 mg | ORAL_TABLET | Freq: Every evening | ORAL | Status: DC | PRN

## 2016-05-02 MED ORDER — WITCH HAZEL-GLYCERIN EX PADS: 1.0000 "application " | MEDICATED_PAD | CUTANEOUS | Status: DC | PRN

## 2016-05-02 MED ORDER — SENNOSIDES-DOCUSATE SODIUM 8.6-50 MG PO TABS
2.0000 | ORAL_TABLET | ORAL | Status: DC
  Administered 2016-05-03 (×2): 2 via ORAL
  Filled 2016-05-02 (×2): qty 2

## 2016-05-02 MED ORDER — NIFEDIPINE ER OSMOTIC RELEASE 30 MG PO TB24
30.0000 mg | ORAL_TABLET | Freq: Every day | ORAL | Status: DC
  Administered 2016-05-03 – 2016-05-04 (×2): 30 mg via ORAL
  Filled 2016-05-02 (×2): qty 1

## 2016-05-02 MED ORDER — IBUPROFEN 600 MG PO TABS
600.0000 mg | ORAL_TABLET | Freq: Four times a day (QID) | ORAL | Status: DC
  Administered 2016-05-02 – 2016-05-04 (×10): 600 mg via ORAL
  Filled 2016-05-02 (×10): qty 1

## 2016-05-02 MED ORDER — SIMETHICONE 80 MG PO CHEW: 80.0000 mg | CHEWABLE_TABLET | ORAL | Status: DC | PRN

## 2016-05-02 MED ORDER — MENTHOL 3 MG MT LOZG: 1.0000 | LOZENGE | OROMUCOSAL | Status: DC | PRN

## 2016-05-02 MED ORDER — SIMETHICONE 80 MG PO CHEW
80.0000 mg | CHEWABLE_TABLET | ORAL | Status: DC
  Administered 2016-05-03 (×2): 80 mg via ORAL
  Filled 2016-05-02: qty 1

## 2016-05-02 MED ORDER — TETANUS-DIPHTH-ACELL PERTUSSIS 5-2.5-18.5 LF-MCG/0.5 IM SUSP: 0.5000 mL | Freq: Once | INTRAMUSCULAR | Status: DC

## 2016-05-02 MED ORDER — SIMETHICONE 80 MG PO CHEW
80.0000 mg | CHEWABLE_TABLET | Freq: Three times a day (TID) | ORAL | Status: DC
  Administered 2016-05-02 – 2016-05-04 (×7): 80 mg via ORAL
  Filled 2016-05-02 (×7): qty 1

## 2016-05-02 MED ORDER — DIPHENHYDRAMINE HCL 25 MG PO CAPS: 25.0000 mg | ORAL_CAPSULE | Freq: Four times a day (QID) | ORAL | Status: DC | PRN

## 2016-05-02 MED ORDER — ACETAMINOPHEN 325 MG PO TABS
650.0000 mg | ORAL_TABLET | ORAL | Status: DC | PRN
  Administered 2016-05-03: 650 mg via ORAL
  Filled 2016-05-02: qty 2

## 2016-05-02 MED ORDER — LACTATED RINGERS IV SOLN
INTRAVENOUS | Status: DC
  Administered 2016-05-02: 07:00:00 via INTRAVENOUS

## 2016-05-02 MED ORDER — COCONUT OIL OIL: 1.0000 "application " | TOPICAL_OIL | Status: DC | PRN

## 2016-05-02 MED ORDER — DIBUCAINE 1 % RE OINT: 1.0000 "application " | TOPICAL_OINTMENT | RECTAL | Status: DC | PRN

## 2016-05-02 MED ORDER — KETOROLAC TROMETHAMINE 30 MG/ML IJ SOLN
INTRAMUSCULAR | Status: AC
  Filled 2016-05-02: qty 1

## 2016-05-02 MED ORDER — OXYTOCIN 40 UNITS IN LACTATED RINGERS INFUSION - SIMPLE MED: 2.5000 [IU]/h | INTRAVENOUS | Status: AC

## 2016-05-02 NOTE — Progress Notes (Signed)
Subjective: Postpartum Day 1: Cesarean Delivery Patient reports tolerating PO and + flatus.    Objective: Vital signs in last 24 hours: Temp:  [98 F (36.7 C)-99.6 F (37.6 C)] 98 F (36.7 C) (04/30 0834) Pulse Rate:  [70-113] 85 (04/30 0834) Resp:  [14-28] 18 (04/30 0834) BP: (120-169)/(61-102) 120/71 (04/30 0834) SpO2:  [94 %-99 %] 94 % (04/30 0834)  Physical Exam:  General: alert and cooperative Lochia: appropriate Uterine Fundus: firm Incision: healing well, no significant drainage DVT Evaluation: No evidence of DVT seen on physical exam.   Recent Labs  05/01/16 1132 05/02/16 0553  HGB 13.5 12.0  HCT 38.6 34.1*    Assessment/Plan: Status post Cesarean section. Postoperative course complicated by gestational HTN BPS are elevated will start procardia.  Pt is asymptomatic  Continue current care.  Avery Eustice A 05/02/2016, 12:57 PM

## 2016-05-02 NOTE — Lactation Note (Signed)
This note was copied from a baby's chart. Lactation Consultation Note  Patient Name: Sabrina Mills [id number] Date: 05/02/2016 Reason for consult: Initial assessment Breastfeeding consultation services and support information given and reviewed.  This is mom's first baby and newborn is 31 hours old.  Mom states baby has been to the breast frequently and without difficulty.  Instructed to feed with any feeding cue and call for assist/concerns prn.  Maternal Data Has patient been taught Hand Expression?: Yes Does the patient have breastfeeding experience prior to this delivery?: No  Feeding Feeding Type: Breast Fed Length of feed: 10 min  LATCH Score/Interventions                      Lactation Tools Discussed/Used     Consult Status Consult Status: PRN    Huston Foley 05/02/2016, 11:52 AM

## 2016-05-02 NOTE — Anesthesia Postprocedure Evaluation (Addendum)
Anesthesia Post Note  Patient: Sabrina Mills  Procedure(s) Performed: Procedure(s) (LRB): CESAREAN SECTION (N/A)  Patient location during evaluation: Mother Baby Anesthesia Type: Epidural Level of consciousness: awake Pain management: pain level controlled Vital Signs Assessment: post-procedure vital signs reviewed and stable Respiratory status: spontaneous breathing Cardiovascular status: stable Postop Assessment: no headache, no backache, epidural receding and patient able to bend at knees Anesthetic complications: no        Last Vitals:  Vitals:   05/02/16 0340 05/02/16 0500  BP: (!) 144/90   Pulse: 98   Resp: 18 18  Temp: 37.1 C 37.1 C    Last Pain:  Vitals:   05/02/16 0643  TempSrc:   PainSc: 0-No pain   Pain Goal: Patients Stated Pain Goal: 3 (05/02/16 0500)               Edison Pace

## 2016-05-03 ENCOUNTER — Encounter (HOSPITAL_COMMUNITY): Payer: Self-pay | Admitting: *Deleted

## 2016-05-03 NOTE — Progress Notes (Signed)
  Subjective: Postpartum Day 2: Cesarean Delivery fetal intolerance to labor Patient reports tolerating PO, + flatus and no problems voiding.  Ambulating, Breastfeeding going well.    Objective: Vital signs in last 24 hours: Temp:  [98 F (36.7 C)-98.5 F (36.9 C)] 98.3 F (36.8 C) (05/01 0553) Pulse Rate:  [78-100] 92 (05/01 0553) Resp:  [17-18] 18 (05/01 0553) BP: (120-147)/(71-86) 131/82 (05/01 0553) SpO2:  [94 %-98 %] 98 % (05/01 0000)  Physical Exam:  General: alert, cooperative and no distress  Abdomen:  Slightly distended, BS+ Lochia: appropriate Uterine Fundus: firm Incision: healing well DVT Evaluation: No evidence of DVT seen on physical exam. Negative Homan's sign.   Recent Labs  05/01/16 1132 05/02/16 0553  HGB 13.5 12.0  HCT 38.6 34.1*    Assessment/Plan: Status post Cesarean section. Doing well postoperatively.  Continue current care.  On Procardia with BP better.  Plan on discharge tomorrow.    Henderson Newcomer Mylisa Brunson 05/03/2016, 8:29 AM

## 2016-05-03 NOTE — Lactation Note (Signed)
This note was copied from a baby's chart. Lactation Consultation Note  Patient Name: Sabrina Mills Date: 05/03/2016   Visited with Mom, baby 80 hrs old. 5.3% weight loss. Output adequate, 7 stools, and 2 voids last 24 hrs. Mom stated baby had just fed on and off for 3 hrs.  Baby dressed and swaddled in football hold, sleeping.  Talked about importance and benefits of STS.  Mom stated she sometimes feels a biting sensation.  Undressed baby and unwrapped from swaddle and assisted with positioning baby STS.  Mom has semi-large nipples (no trauma noted), and baby is 5 lbs 12 oz.  Talked about importance of baby latching deeply onto areola.  Mom stated she did hear intermittent swallowing. Baby noted to be a little jittery.  RN aware.  Placed baby STS on Mom's chest.   Mom to call for assistance and assessment at next feeding.   Judee Clara 05/03/2016, 10:43 AM

## 2016-05-04 ENCOUNTER — Inpatient Hospital Stay (HOSPITAL_COMMUNITY): Admission: RE | Admit: 2016-05-04 | Payer: BLUE CROSS/BLUE SHIELD | Source: Ambulatory Visit

## 2016-05-04 MED ORDER — NIFEDIPINE ER 30 MG PO TB24: 30.0000 mg | ORAL_TABLET | Freq: Every day | ORAL | 4 refills | Status: DC

## 2016-05-04 MED ORDER — IBUPROFEN 600 MG PO TABS: 600.0000 mg | ORAL_TABLET | Freq: Four times a day (QID) | ORAL | 0 refills | Status: DC

## 2016-05-04 MED ORDER — OXYCODONE-ACETAMINOPHEN 5-325 MG PO TABS: 1.0000 | ORAL_TABLET | Freq: Four times a day (QID) | ORAL | 0 refills | Status: DC | PRN

## 2016-05-04 MED ORDER — OXYCODONE-ACETAMINOPHEN 5-325 MG PO TABS
1.0000 | ORAL_TABLET | Freq: Four times a day (QID) | ORAL | Status: DC | PRN
  Administered 2016-05-04: 2 via ORAL
  Filled 2016-05-04: qty 2

## 2016-05-04 MED ORDER — MEASLES, MUMPS & RUBELLA VAC ~~LOC~~ INJ
0.5000 mL | INJECTION | Freq: Once | SUBCUTANEOUS | Status: AC
  Administered 2016-05-04: 0.5 mL via SUBCUTANEOUS
  Filled 2016-05-04 (×3): qty 0.5

## 2016-05-04 NOTE — Lactation Note (Signed)
This note was copied from a baby's chart. Lactation Consultation Note  Baby 12 hours old.  < 6 lbs.  8.2% weight loss. Mother stated she did not know how to hand express. Reviewed hand expression and mother easily expressed transitional breastmilk. Baby latched in football position and took a few minutes to get into a rhythmical pattern but then sucks and swallows were noted. Encouraged mother to post pump 4-6 times a day for 10-15 min and give baby back volume pumped in addition to breastfeeding to help stabilize weight loss. Provided mother w/ hand pump and reviewed milk storage. Pacifier use not recommended at this time.  Mom encouraged to feed baby 8-12 times/24 hours and with feeding cues at least q 3 hours. Mother pumped approx 5 ml with manual pump and it was given to baby after feeding w/ slow flow nipple.  Suggest she could also use a spoon. Reviewed engorgement care and monitoring voids/stools. Mother has DEBP at home. Suggest she call if she has additional questions.    Patient Name: Boy Samina Weekes VHQIO'N Date: 05/04/2016 Reason for consult: Follow-up assessment   Maternal Data    Feeding Feeding Type: Breast Fed Length of feed: 15 min  LATCH Score/Interventions Latch: Repeated attempts needed to sustain latch, nipple held in mouth throughout feeding, stimulation needed to elicit sucking reflex.  Audible Swallowing: A few with stimulation  Type of Nipple: Everted at rest and after stimulation  Comfort (Breast/Nipple): Soft / non-tender     Hold (Positioning): No assistance needed to correctly position infant at breast.  LATCH Score: 8  Lactation Tools Discussed/Used     Consult Status Consult Status: Complete    Hardie Pulley 05/04/2016, 9:27 AM

## 2016-05-04 NOTE — Discharge Instructions (Signed)
Postpartum Depression and Baby Blues The postpartum period begins right after the birth of a baby. During this time, there is often a great amount of joy and excitement. It is also a time of many changes in the life of the parents. Regardless of how many times a mother gives birth, each child brings new challenges and dynamics to the family. It is not unusual to have feelings of excitement along with confusing shifts in moods, emotions, and thoughts. All mothers are at risk of developing postpartum depression or the "baby blues." These mood changes can occur right after giving birth, or they may occur many months after giving birth. The baby blues or postpartum depression can be mild or severe. Additionally, postpartum depression can go away rather quickly, or it can be a long-term condition. What are the causes? Raised hormone levels and the rapid drop in those levels are thought to be a main cause of postpartum depression and the baby blues. A number of hormones change during and after pregnancy. Estrogen and progesterone usually decrease right after the delivery of your baby. The levels of thyroid hormone and various cortisol steroids also rapidly drop. Other factors that play a role in these mood changes include major life events and genetics. What increases the risk? If you have any of the following risks for the baby blues or postpartum depression, know what symptoms to watch out for during the postpartum period. Risk factors that may increase the likelihood of getting the baby blues or postpartum depression include:  Having a personal or family history of depression.  Having depression while being pregnant.  Having premenstrual mood issues or mood issues related to oral contraceptives.  Having a lot of life stress.  Having marital conflict.  Lacking a social support network.  Having a baby with special needs.  Having health problems, such as diabetes.  What are the signs or  symptoms? Symptoms of baby blues include:  Brief changes in mood, such as going from extreme happiness to sadness.  Decreased concentration.  Difficulty sleeping.  Crying spells, tearfulness.  Irritability.  Anxiety.  Symptoms of postpartum depression typically begin within the first month after giving birth. These symptoms include:  Difficulty sleeping or excessive sleepiness.  Marked weight loss.  Agitation.  Feelings of worthlessness.  Lack of interest in activity or food.  Postpartum psychosis is a very serious condition and can be dangerous. Fortunately, it is rare. Displaying any of the following symptoms is cause for immediate medical attention. Symptoms of postpartum psychosis include:  Hallucinations and delusions.  Bizarre or disorganized behavior.  Confusion or disorientation.  How is this diagnosed? A diagnosis is made by an evaluation of your symptoms. There are no medical or lab tests that lead to a diagnosis, but there are various questionnaires that a health care provider may use to identify those with the baby blues, postpartum depression, or psychosis. Often, a screening tool called the Edinburgh Postnatal Depression Scale is used to diagnose depression in the postpartum period. How is this treated? The baby blues usually goes away on its own in 1-2 weeks. Social support is often all that is needed. You will be encouraged to get adequate sleep and rest. Occasionally, you may be given medicines to help you sleep. Postpartum depression requires treatment because it can last several months or longer if it is not treated. Treatment may include individual or group therapy, medicine, or both to address any social, physiological, and psychological factors that may play a role in the   depression. Regular exercise, a healthy diet, rest, and social support may also be strongly recommended. Postpartum psychosis is more serious and needs treatment right away.  Hospitalization is often needed. Follow these instructions at home:  Get as much rest as you can. Nap when the baby sleeps.  Exercise regularly. Some women find yoga and walking to be beneficial.  Eat a balanced and nourishing diet.  Do little things that you enjoy. Have a cup of tea, take a bubble bath, read your favorite magazine, or listen to your favorite music.  Avoid alcohol.  Ask for help with household chores, cooking, grocery shopping, or running errands as needed. Do not try to do everything.  Talk to people close to you about how you are feeling. Get support from your partner, family members, friends, or other new moms.  Try to stay positive in how you think. Think about the things you are grateful for.  Do not spend a lot of time alone.  Only take over-the-counter or prescription medicine as directed by your health care provider.  Keep all your postpartum appointments.  Let your health care provider know if you have any concerns. Contact a health care provider if: You are having a reaction to or problems with your medicine. Get help right away if:  You have suicidal feelings.  You think you may harm the baby or someone else. This information is not intended to replace advice given to you by your health care provider. Make sure you discuss any questions you have with your health care provider. Document Released: 09/24/2003 Document Revised: 05/28/2015 Document Reviewed: 10/01/2012 Elsevier Interactive Patient Education  2017 [employer] Inc[employer]  

## 2016-05-04 NOTE — Discharge Summary (Signed)
OB Discharge Summary     Patient Name: Sabrina Mills DOB: 1985/10/11 MRN: [id number]  Date of admission: 05/01/2016 Delivering MD: Gerald Leitz   Date of discharge: 05/04/2016  Admitting diagnosis: 40wks contractions or less Intrauterine pregnancy: [redacted]w[redacted]d     Secondary diagnosis:  Principal Problem:   S/P cesarean section Active Problems:   Normal labor   Gestational hypertension affecting first pregnancy  Additional problems: Rubella NI, Rodman Trait     Discharge diagnosis: Term Pregnancy Delivered, Gestational Hypertension and S/P Primary C/S                                                                                                Post partum procedures:None  Augmentation: AROM and Pitocin  Complications: None  Hospital course:  Onset of Labor With Unplanned C/S  31 y.o. yo G1P1001 at [redacted]w[redacted]d was admitted in Latent Labor on 05/01/2016. Patient had a labor course significant for pitocin augmentation and AROM. Membrane Rupture Time/Date: 4:01 PM ,05/01/2016   The patient went for cesarean section due to Non-Reassuring FHR, and delivered a Viable infant,05/01/2016  Details of operation can be found in separate operative note. Patient had an uncomplicated postpartum course.  She is ambulating,tolerating a regular diet, passing flatus, and urinating well.  Patient is discharged home in stable condition 05/04/16.  Physical exam  Vitals:   05/03/16 0856 05/03/16 0900 05/03/16 1100 05/03/16 1900  BP: (!) 143/96 (!) 142/87 135/76 (!) 147/84  Pulse: (!) 111 (!) 110 100 (!) 110  Resp: 18   18  Temp: 98.5 F (36.9 C)   98.7 F (37.1 C)  TempSrc: Oral   Oral  SpO2: 98%   98%  Weight:      Height:       General: alert, cooperative and no distress  Mood/Affect: Labile Chest: HRRR, CTA Lochia: appropriate Uterine Fundus: firm, Appropriately Tender Incision: Healing well with no significant drainage, Honeycomb dressing to be replaced DVT Evaluation: Calf/Ankle edema is  present  Labs: Lab Results  Component Value Date   WBC 11.8 (H) 05/02/2016   HGB 12.0 05/02/2016   HCT 34.1 (L) 05/02/2016   MCV 82.6 05/02/2016   PLT 265 05/02/2016   CMP Latest Ref Rng & Units 05/01/2016  Glucose 65 - 99 mg/dL 88  BUN 6 - 20 mg/dL 8  Creatinine 2.72 - 5.36 mg/dL 6.44  Sodium 034 - 742 mmol/L 134(L)  Potassium 3.5 - 5.1 mmol/L 4.1  Chloride 101 - 111 mmol/L 104  CO2 22 - 32 mmol/L 21(L)  Calcium 8.9 - 10.3 mg/dL 9.1  Total Protein 6.5 - 8.1 g/dL 6.6  Total Bilirubin 0.3 - 1.2 mg/dL 0.8  Alkaline Phos 38 - 126 U/L 158(H)  AST 15 - 41 U/L 19  ALT 14 - 54 U/L 11(L)    Discharge instruction: per After Visit Summary and "Baby and Me Booklet". Remove steri-strips in 7-10 days.  Incision care guidelines: how to clean, when to call, and anticipated healing. Incision care, Postpartum Depression, Activity Restrictions, Breastfeeding, Pain Mgmt, Who and when to call for PP Concerns, Follow Up Appointment, Information  Sheet Given PPD and BB.  After visit meds:  Allergies as of 05/04/2016   No Known Allergies     Medication List    TAKE these medications   ibuprofen 600 MG tablet Commonly known as:  ADVIL,MOTRIN Take 1 tablet (600 mg total) by mouth every 6 (six) hours. What changed:  medication strength  how much to take  when to take this  reasons to take this   NIFEdipine 30 MG 24 hr tablet Commonly known as:  PROCARDIA-XL/ADALAT CC Take 1 tablet (30 mg total) by mouth daily.   prenatal multivitamin Tabs tablet Take 1 tablet by mouth daily at 12 noon.       Diet: routine diet  Activity: Advance as tolerated. Pelvic rest for 6 weeks.   Outpatient follow up:1wk for BP Check Follow up Appt:No future appointments. Follow up Visit:No Follow-up on file.  Postpartum contraception: Not Discussed  Newborn Data: Live born female  Birth Weight: 6 lb 1.5 oz (2764 g) APGAR: 9, 9  Baby Feeding: Breast Disposition:home with mother   05/04/2016 Cherre Robins, CNM

## 2016-05-11 DIAGNOSIS — I10 Essential (primary) hypertension: Secondary | ICD-10-CM | POA: Diagnosis not present

## 2016-06-04 NOTE — Addendum Note (Signed)
Addendum  created 06/04/16 0901 by Chinonso Linker, MD   Sign clinical note    

## 2016-09-13 DIAGNOSIS — Z01419 Encounter for gynecological examination (general) (routine) without abnormal findings: Secondary | ICD-10-CM | POA: Diagnosis not present

## 2016-09-13 DIAGNOSIS — Z3040 Encounter for surveillance of contraceptives, unspecified: Secondary | ICD-10-CM | POA: Diagnosis not present

## 2016-09-13 DIAGNOSIS — Z6828 Body mass index (BMI) 28.0-28.9, adult: Secondary | ICD-10-CM | POA: Diagnosis not present

## 2016-10-04 DIAGNOSIS — Z23 Encounter for immunization: Secondary | ICD-10-CM | POA: Diagnosis not present

## 2017-05-22 DIAGNOSIS — F4322 Adjustment disorder with anxiety: Secondary | ICD-10-CM | POA: Diagnosis not present

## 2017-06-05 DIAGNOSIS — F4322 Adjustment disorder with anxiety: Secondary | ICD-10-CM | POA: Diagnosis not present

## 2017-08-31 ENCOUNTER — Emergency Department (HOSPITAL_COMMUNITY): Payer: BLUE CROSS/BLUE SHIELD

## 2017-08-31 ENCOUNTER — Encounter (HOSPITAL_COMMUNITY): Payer: Self-pay | Admitting: *Deleted

## 2017-08-31 ENCOUNTER — Other Ambulatory Visit: Payer: Self-pay

## 2017-08-31 ENCOUNTER — Emergency Department (HOSPITAL_COMMUNITY)
Admission: EM | Admit: 2017-08-31 | Discharge: 2017-08-31 | Disposition: A | Discharge status: Home / Self Care | Payer: BLUE CROSS/BLUE SHIELD | Attending: Emergency Medicine | Admitting: Emergency Medicine

## 2017-08-31 DIAGNOSIS — R079 Chest pain, unspecified: Secondary | ICD-10-CM | POA: Insufficient documentation

## 2017-08-31 DIAGNOSIS — Z5321 Procedure and treatment not carried out due to patient leaving prior to being seen by health care provider: Secondary | ICD-10-CM | POA: Diagnosis not present

## 2017-08-31 LAB — CBC
HCT: 39.2 % (ref 36.0–46.0)
HEMOGLOBIN: 12.9 g/dL (ref 12.0–15.0)
MCH: 27.6 pg (ref 26.0–34.0)
MCHC: 32.9 g/dL (ref 30.0–36.0)
MCV: 83.8 fL (ref 78.0–100.0)
PLATELETS: 283 10*3/uL (ref 150–400)
RBC: 4.68 MIL/uL (ref 3.87–5.11)
RDW: 13.8 % (ref 11.5–15.5)
WBC: 5.6 10*3/uL (ref 4.0–10.5)

## 2017-08-31 LAB — BASIC METABOLIC PANEL
ANION GAP: 10 (ref 5–15)
BUN: 10 mg/dL (ref 6–20)
CALCIUM: 9.3 mg/dL (ref 8.9–10.3)
CO2: 23 mmol/L (ref 22–32)
CREATININE: 0.74 mg/dL (ref 0.44–1.00)
Chloride: 105 mmol/L (ref 98–111)
GFR calc Af Amer: >60 mL/min (ref >60)
GLUCOSE: 95 mg/dL (ref 70–99)
Potassium: 4.1 mmol/L (ref 3.5–5.1)
Sodium: 138 mmol/L (ref 135–145)

## 2017-08-31 LAB — I-STAT BETA HCG BLOOD, ED (MC, WL, AP ONLY): I-stat hCG, quantitative: <5 m[IU]/mL (ref <5)

## 2017-08-31 LAB — I-STAT TROPONIN, ED: TROPONIN I, POC: 0 ng/mL (ref 0.00–0.08)

## 2017-08-31 NOTE — ED Notes (Signed)
Pt states she was supposed to see a doctor. Out at nurses station states she just wants to leave and knows everything is normal. Informed she should wait for the provider but patient states she is just going to leave

## 2017-08-31 NOTE — ED Triage Notes (Signed)
Pt in c/o chest pain for the last week, worse this morning, pain is worse with inspiration or movement, denies shortness of breath or n/v, no distress noted

## 2017-09-13 DIAGNOSIS — Z01419 Encounter for gynecological examination (general) (routine) without abnormal findings: Secondary | ICD-10-CM | POA: Diagnosis not present

## 2017-09-13 DIAGNOSIS — Z6828 Body mass index (BMI) 28.0-28.9, adult: Secondary | ICD-10-CM | POA: Diagnosis not present

## 2017-10-23 ENCOUNTER — Ambulatory Visit (INDEPENDENT_AMBULATORY_CARE_PROVIDER_SITE_OTHER): Payer: BLUE CROSS/BLUE SHIELD | Admitting: Primary Care

## 2017-10-23 ENCOUNTER — Encounter: Payer: Self-pay | Admitting: Primary Care

## 2017-10-23 DIAGNOSIS — F329 Major depressive disorder, single episode, unspecified: Secondary | ICD-10-CM | POA: Diagnosis not present

## 2017-10-23 DIAGNOSIS — F4323 Adjustment disorder with mixed anxiety and depressed mood: Secondary | ICD-10-CM | POA: Insufficient documentation

## 2017-10-23 DIAGNOSIS — F419 Anxiety disorder, unspecified: Secondary | ICD-10-CM | POA: Diagnosis not present

## 2017-10-23 DIAGNOSIS — F32A Depression, unspecified: Secondary | ICD-10-CM

## 2017-10-23 MED ORDER — SERTRALINE HCL 25 MG PO TABS: 25.0000 mg | ORAL_TABLET | Freq: Every day | ORAL | 1 refills | Status: DC

## 2017-10-23 NOTE — Assessment & Plan Note (Addendum)
History of symptoms over the last 3-4 months, increase recently. Sounds like she also had post partum depression in April 2018. Suspect chest tightness secondary to anxiety given normal labs in ED and lack of other cardinal symptoms. Do not suspect PE. GAD 7 score of 21 and PHQ 9 score of 21 today. Denies SI/HI.   Recommended several options for treatment including therapy and medication, she opts for both. Referral placed for therapy. Rx for Zoloft 25 mg sent to pharmacy.   Patient is to take 1/2 tablet daily for 8 days, then advance to 1 full tablet thereafter. We discussed possible side effects of headache, GI upset, drowsiness, and SI/HI. If thoughts of SI/HI develop, we discussed to present to the emergency immediately. Patient verbalized understanding.   Follow up in 6 weeks for re-evaluation.

## 2017-10-23 NOTE — Progress Notes (Signed)
Subjective:    Patient ID: Sabrina Mills, female    DOB: 11-Jan-1985, 32 y.o.   MRN: [id number]  HPI  Sabrina Mills is a 32 year old female who presents today with a chief complaint of chest tightness, anxiety, and depression.    She presented to the emergency department in late August 2019 with reports of chest pain over the last week. She left before being evaluated by a provider. She did complete labs and ECG which were unremarkable.   She's been experiencing intermittent chest tightness, substernal, since early August 2019. She's been under a lot of stress over the last several months as she has a baby, her husband quit his job to start a new business, and she's trying to work. She's been also experiencing difficulty getting out of bed, tearfulness, anxiety, difficulty focusing. She thinks she had postpartum depression in April 2018 but was never treated. Her chest tightness comes only during periods of anxiety. She is not managed on birth control, denies chest pain with deep inspiration. Last menstrual period was October 2nd, 2019.  GAD 7 score of 21 and PHQ 9 score of 21.   Review of Systems  Respiratory: Negative for shortness of breath.   Cardiovascular: Negative for chest pain and leg swelling.  Genitourinary:       Not on OCP's  Psychiatric/Behavioral: Negative for suicidal ideas. The patient is nervous/anxious.        See HPI       Past Medical History:  Diagnosis Date  . Asthma      Social History   Socioeconomic History  . Marital status: Married    Spouse name: Not on file  . Number of children: Not on file  . Years of education: Not on file  . Highest education level: Not on file  Occupational History  . Not on file  Social Needs  . Financial resource strain: Not on file  . Food insecurity:    Worry: Not on file    Inability: Not on file  . Transportation needs:    Medical: Not on file    Non-medical: Not on file  Tobacco Use  . Smoking status: Never  Smoker  . Smokeless tobacco: Never Used  Substance and Sexual Activity  . Alcohol use: No    Alcohol/week: 0.0 standard drinks  . Drug use: No  . Sexual activity: Not on file  Lifestyle  . Physical activity:    Days per week: Not on file    Minutes per session: Not on file  . Stress: Not on file  Relationships  . Social connections:    Talks on phone: Not on file    Gets together: Not on file    Attends religious service: Not on file    Active member of club or organization: Not on file    Attends meetings of clubs or organizations: Not on file    Relationship status: Not on file  . Intimate partner violence:    Fear of current or ex partner: Not on file    Emotionally abused: Not on file    Physically abused: Not on file    Forced sexual activity: Not on file  Other Topics Concern  . Not on file  Social History Narrative   Married.   Moved from [place].   Works at [employer].   No children.   Enjoys traveling, cooking, spending time with family.    Past Surgical History:  Procedure Laterality Date  .  CESAREAN SECTION N/A 05/01/2016   Procedure: CESAREAN SECTION;  Surgeon: Gerald Leitz, MD;  Location: [facility] BIRTHING SUITES;  Service: Obstetrics;  Laterality: N/A;  . NO PAST SURGERIES      Family History  Problem Relation Age of Onset  . Hypertension Father     No Known Allergies  Current Outpatient Medications on File Prior to Visit  Medication Sig Dispense Refill  . Prenatal Vit-Fe Fumarate-FA (PRENATAL MULTIVITAMIN) TABS tablet Take 1 tablet by mouth daily at 12 noon.     No current facility-administered medications on file prior to visit.     BP 124/78   Pulse 82   Temp 98.2 F (36.8 C) (Oral)   Ht 5\' 4"  (1.626 m)   Wt 165 lb 8 oz (75.1 kg)   LMP 10/04/2017   SpO2 99%   Breastfeeding? Yes   BMI 28.41 kg/m    Objective:   Physical Exam  Constitutional: She appears well-nourished.  Neck: Neck supple.  Cardiovascular: Normal rate and  regular rhythm.  Respiratory: Effort normal and breath sounds normal.  Skin: Skin is warm and dry.  Psychiatric: She has a normal mood and affect.  Tearfulness during exam           Assessment & Plan:

## 2017-10-23 NOTE — Patient Instructions (Signed)
Start sertraline (Zoloft) 25 mg tablets for anxiety and depression. Start by taking 1/2 tablet daily for 6 days, then increase to 1 full tablet thereafter.   You will be contacted regarding your referral to therapy.  Please let us know if you have not been contacted within one week.   Schedule a follow up visit in 6 weeks for re-evaluation. Please call me sooner if you have any problems.   It was a pleasure to see you today!

## 2017-12-04 ENCOUNTER — Ambulatory Visit: Payer: BLUE CROSS/BLUE SHIELD | Admitting: Primary Care

## 2017-12-13 ENCOUNTER — Emergency Department
Admission: EM | Admit: 2017-12-13 | Discharge: 2017-12-13 | Disposition: A | Discharge status: Home / Self Care | Payer: BLUE CROSS/BLUE SHIELD | Attending: Emergency Medicine | Admitting: Emergency Medicine

## 2017-12-13 ENCOUNTER — Encounter: Payer: Self-pay | Admitting: Emergency Medicine

## 2017-12-13 ENCOUNTER — Emergency Department: Payer: BLUE CROSS/BLUE SHIELD

## 2017-12-13 DIAGNOSIS — F419 Anxiety disorder, unspecified: Secondary | ICD-10-CM | POA: Diagnosis not present

## 2017-12-13 DIAGNOSIS — R52 Pain, unspecified: Secondary | ICD-10-CM

## 2017-12-13 DIAGNOSIS — J45909 Unspecified asthma, uncomplicated: Secondary | ICD-10-CM | POA: Diagnosis not present

## 2017-12-13 DIAGNOSIS — F329 Major depressive disorder, single episode, unspecified: Secondary | ICD-10-CM | POA: Insufficient documentation

## 2017-12-13 DIAGNOSIS — R0789 Other chest pain: Secondary | ICD-10-CM

## 2017-12-13 DIAGNOSIS — R072 Precordial pain: Secondary | ICD-10-CM | POA: Insufficient documentation

## 2017-12-13 DIAGNOSIS — Z79899 Other long term (current) drug therapy: Secondary | ICD-10-CM | POA: Insufficient documentation

## 2017-12-13 DIAGNOSIS — R222 Localized swelling, mass and lump, trunk: Secondary | ICD-10-CM | POA: Diagnosis not present

## 2017-12-13 MED ORDER — ETODOLAC 400 MG PO TABS: 400.0000 mg | ORAL_TABLET | Freq: Two times a day (BID) | ORAL | 0 refills | Status: DC

## 2017-12-13 NOTE — Discharge Instructions (Addendum)
Follow-up with your primary care provider.  Call make an appointment for reevaluation in approximately 2 months. Follow-up with your doctor sooner if any changes or urgent concerns.  Begin taking etodolac 400 mg twice daily with food for the next 2 weeks.

## 2017-12-13 NOTE — ED Triage Notes (Signed)
Patient presents to ED via POV from home with c/o mass to center of chest. Patient reports it has been there since August. No mass visible to this RN but can be palpated. Patient reports negative workup in August.

## 2017-12-13 NOTE — ED Provider Notes (Addendum)
[facility] Emergency Department Provider Note  ____________________________________________   First MD Initiated Contact with Patient 12/13/17 1320     (approximate)  I have reviewed the triage vital signs and the nursing notes.   HISTORY  Chief Complaint Mass   HPI Sabrina Mills is a 32 y.o. female presents to the ED with complaint of mass to the center of her chest that is been there for approximately 4 months.  Patient states that she was seen at [facility] in August where she had test done.  Patient left AMA and understands that her x-rays and lab work were normal.  She states that now the area is more pronounced than it was in August.  Has not seen her PCP for this.  Patient denies any fever, cough, shortness of breath.  She also denies any trauma to her chest.  Patient rates her pain as 6 out of 10.   Past Medical History:  Diagnosis Date  . Asthma     Patient Active Problem List   Diagnosis Date Noted  . Anxiety and depression 10/23/2017  . Gestational hypertension affecting first pregnancy 05/01/2016  . Preventative health care 06/04/2015    Past Surgical History:  Procedure Laterality Date  . CESAREAN SECTION N/A 05/01/2016   Procedure: CESAREAN SECTION;  Surgeon: Gerald Leitzara Cole, MD;  [facility];  Service: Obstetrics;  Laterality: N/A;  . NO PAST SURGERIES      Prior to Admission medications   Medication Sig Start Date End Date Taking? Authorizing Provider  etodolac (LODINE) 400 MG tablet Take 1 tablet (400 mg total) by mouth 2 (two) times daily. With food 12/13/17   Tommi RumpsSummers, Anala Whisenant L, PA-C  Prenatal Vit-Fe Fumarate-FA (PRENATAL MULTIVITAMIN) TABS tablet Take 1 tablet by mouth daily at 12 noon.    [provider]  sertraline (ZOLOFT) 25 MG tablet Take 1 tablet (25 mg total) by mouth daily. 10/23/17   Doreene Nestlark, Katherine K, NP    Allergies Patient has no known allergies.  Family History  Problem Relation Age of  Onset  . Hypertension Father     Social History Social History   Tobacco Use  . Smoking status: Never Smoker  . Smokeless tobacco: Never Used  Substance Use Topics  . Alcohol use: Yes    Alcohol/week: 0.0 standard drinks    Comment: Social   . Drug use: No    Review of Systems Constitutional: No fever/chills Cardiovascular: Denies chest pain. Respiratory: Denies shortness of breath. Gastrointestinal: No abdominal pain.  No nausea, no vomiting.  Musculoskeletal: Negative for muscle aches or joint pain. Skin: Negative for rash.  Questionable for soft tissue mass. Neurological: Negative for headaches, focal weakness or numbness. ___________________________________________   PHYSICAL EXAM:  VITAL SIGNS: ED Triage Vitals  Enc Vitals Group     BP 12/13/17 1253 (!) 160/99     Pulse Rate 12/13/17 1253 69     Resp 12/13/17 1253 16     Temp 12/13/17 1253 98.2 F (36.8 C)     Temp Source 12/13/17 1253 Oral     SpO2 12/13/17 1253 99 %     Weight 12/13/17 1251 165 lb (74.8 kg)     Height 12/13/17 1251 5\' 3"  (1.6 m)     Head Circumference --      Peak Flow --      Pain Score 12/13/17 1253 6     Pain Loc --      Pain Edu? --  Excl. in GC? --    Constitutional: Alert and oriented. Well appearing and in no acute distress. Eyes: Conjunctivae are normal.  Head: Atraumatic. Neck: No stridor.   Cardiovascular: Normal rate, regular rhythm. Grossly normal heart sounds.  Good peripheral circulation. Respiratory: Normal respiratory effort.  No retractions. Lungs CTAB. Musculoskeletal: Moves upper and lower extremities with any difficulty and normal gait was noted. Neurologic:  Normal speech and language. No gross focal neurologic deficits are appreciated. No gait instability. Skin:  Skin is warm, dry and intact.  There is a 2 cm round soft tissue area on the upper portion of the sternum that is tender to palpation. Psychiatric: Mood and affect are normal. Speech and behavior are  normal.  ____________________________________________   LABS (all labs ordered are listed, but only abnormal results are displayed)  Labs Reviewed - No data to display   RADIOLOGY  Official radiology report(s): [place] Chest Soft Tissue  Result Date: 12/13/2017 CLINICAL DATA:  Sternal palpable bump.  Pain. EXAM: ULTRASOUND OF STERNUM SOFT TISSUES TECHNIQUE: Ultrasound examination of the head and neck soft tissues was performed in the area of clinical concern. COMPARISON:  Chest two-view 08/31/2017 FINDINGS: Limited ultrasound scanning of the sternum. Ultrasound has a limited role in evaluation of bone. There is normal cortex in the manubrium and body of the sternum. Between the manubrium and body of the sternum is an area of hypoechoic tissue which may be the sternomanubrial joint. This appears to be the palpable abnormality based on the sonographer's assessment at the time. No cyst identified. IMPRESSION: Ultrasound has a limited role in evaluation of bone. The sternal mandibular joint appears somewhat prominent and may be arthritic and palpable. If there is concern of a bony or soft tissue mass, CT chest is suggested for further evaluation. Electronically Signed   By: Marlan Palau M.D.   On: 12/13/2017 15:10   ____________________________________________   PROCEDURES  Procedure(s) performed: None  Procedures  Critical Care performed: No  ____________________________________________   INITIAL IMPRESSION / ASSESSMENT AND PLAN / ED COURSE  As part of my medical decision making, I reviewed the following data within the electronic MEDICAL RECORD NUMBER Notes from prior ED visits and [place] Controlled Substance Database  Patient presents to the ED with complaint of questionable soft tissue mass on her sternum for the last 4 months.  She states she was seen at [facility] in August and tests were done.  Is her understanding that everything was negative.  She did walk out AMA and was not seen by the  provider.  Patient denies any trauma to the area and there is been no respiratory symptoms.  Patient is anxious that it is become more prominent and wants to be evaluated.  Ultrasound soft tissue of the chest was performed and radiologist did not see a cystic formation or mass.  There was some changes to suggest arthritic changes in the sternomanubrial joint.  This was explained to the patient and she agrees to try etodolac 400 mg twice daily with food.  She is to follow-up with her PCP and approximately 2 months for reevaluation and CT evaluation if symptoms persist.  ____________________________________________   FINAL CLINICAL IMPRESSION(S) / ED DIAGNOSES  Final diagnoses:  Pain  Sternal pain     ED Discharge Orders         Ordered    etodolac (LODINE) 400 MG tablet  2 times daily     12/13/17 1527  Note:  This document was prepared using Dragon voice recognition software and may include unintentional dictation errors.    Tommi Rumps, PA-C 12/13/17 1535    Tommi Rumps, PA-C 12/13/17 1536    Emily Filbert, MD 12/14/17 [phone]

## 2017-12-14 ENCOUNTER — Ambulatory Visit (INDEPENDENT_AMBULATORY_CARE_PROVIDER_SITE_OTHER): Payer: BLUE CROSS/BLUE SHIELD | Admitting: Primary Care

## 2017-12-14 ENCOUNTER — Encounter: Payer: Self-pay | Admitting: Primary Care

## 2017-12-14 ENCOUNTER — Encounter: Payer: Self-pay | Admitting: *Deleted

## 2017-12-14 DIAGNOSIS — F4323 Adjustment disorder with mixed anxiety and depressed mood: Secondary | ICD-10-CM

## 2017-12-14 DIAGNOSIS — R222 Localized swelling, mass and lump, trunk: Secondary | ICD-10-CM

## 2017-12-14 MED ORDER — SERTRALINE HCL 25 MG PO TABS: 25.0000 mg | ORAL_TABLET | Freq: Every day | ORAL | 1 refills | Status: DC

## 2017-12-14 NOTE — Progress Notes (Signed)
Subjective:    Patient ID: Sabrina Mills, female    DOB: [birth year], 32 y.o.   MRN: [id number]  HPI  Ms. Sabrina Mills is a 32 year old female who presents today for follow up of anxiety and depression.  She was last evaluated on 10/23/17 with a 3-4 month history of increased symptoms of chest tightness, tearfulness, feeling anxious, difficulty focusing. Symptoms originally began post partum in April 2018, never treated. GAD 7 score of 21 and PHQ 9 score of 21 so she was treated with Zoloft 25 mg and referred to therapy.  Since her last visit she was seen in the emergency department at [facility] yesterday for a chief complaint of substernal mass. She underwent US of her chest which showed sternal mandibular joint prominence that was suspected to be arthritic. She was provided with a prescription for etodolac and recommended to have CT chest if symptoms persisted.  Since initiation of Zoloft 25 mg she has noticed improvement. She doesn't feel as depressed, less tearfulness, better focus, feels slightly less anxious, feels more like herself before having her baby in 2018. She has not yet met with a therapist, plans on scheduling an appointment. Denies SI/HI. GAD 7 score of 15 and PHQ 9 score of 8 today.   Review of Systems  Respiratory: Negative for shortness of breath.   Cardiovascular: Negative for chest pain.  Neurological: Negative for dizziness and headaches.  Psychiatric/Behavioral: The patient is not nervous/anxious.        See HPI       Past Medical History:  Diagnosis Date  . Asthma      Social History   Socioeconomic History  . Marital status: Married    Spouse name: Not on file  . Number of children: Not on file  . Years of education: Not on file  . Highest education level: Not on file  Occupational History  . Not on file  Social Needs  . Financial resource strain: Not on file  . Food insecurity:    Worry: Not on file    Inability: Not on file  . Transportation needs:   Medical: Not on file    Non-medical: Not on file  Tobacco Use  . Smoking status: Never Smoker  . Smokeless tobacco: Never Used  Substance and Sexual Activity  . Alcohol use: Yes    Alcohol/week: 0.0 standard drinks    Comment: Social   . Drug use: No  . Sexual activity: Not on file  Lifestyle  . Physical activity:    Days per week: Not on file    Minutes per session: Not on file  . Stress: Not on file  Relationships  . Social connections:    Talks on phone: Not on file    Gets together: Not on file    Attends religious service: Not on file    Active member of club or organization: Not on file    Attends meetings of clubs or organizations: Not on file    Relationship status: Not on file  . Intimate partner violence:    Fear of current or ex partner: Not on file    Emotionally abused: Not on file    Physically abused: Not on file    Forced sexual activity: Not on file  Other Topics Concern  . Not on file  Social History Narrative   Married.   Moved from [place].   Works at [employer].   No children.   Enjoys traveling, cooking, spending  time with family.    Past Surgical History:  Procedure Laterality Date  . CESAREAN SECTION N/A 05/01/2016   Procedure: CESAREAN SECTION;  Surgeon: Christophe Louis, MD;  Location: [place];  Service: Obstetrics;  Laterality: N/A;  . NO PAST SURGERIES      Family History  Problem Relation Age of Onset  . Hypertension Father     No Known Allergies  Current Outpatient Medications on File Prior to Visit  Medication Sig Dispense Refill  . etodolac (LODINE) 400 MG tablet Take 1 tablet (400 mg total) by mouth 2 (two) times daily. With food 30 tablet 0  . Prenatal Vit-Fe Fumarate-FA (PRENATAL MULTIVITAMIN) TABS tablet Take 1 tablet by mouth daily at 12 noon.     No current facility-administered medications on file prior to visit.     BP 140/80   Pulse 84   Temp 98.1 F (36.7 C) (Oral)   Ht 5' 4"  (1.626 m)   Wt  167 lb 12 oz (76.1 kg)   LMP 11/25/2017   SpO2 98%   BMI 28.79 kg/m    Objective:   Physical Exam  Constitutional: She appears well-nourished.  Neck: Neck supple.  Cardiovascular: Normal rate and regular rhythm.  Respiratory: Effort normal and breath sounds normal.    Bony appearing "mass" structure to anterior upper sternum. Firm. Non tender.  Skin: Skin is warm and dry.  Psychiatric: She has a normal mood and affect.           Assessment & Plan:

## 2017-12-14 NOTE — Patient Instructions (Signed)
Continue sertraline (Zoloft) 25 mg daily for anxiety and depression.  Please schedule an appointment with the therapist as discussed.  It was a pleasure to see you today!

## 2017-12-14 NOTE — Assessment & Plan Note (Signed)
Appears to be part of bony anatomy. Imaging reviewed from ED, unremarkable. She will hold off on CT chest for now, this is reasonable. She will update if she notices any changes, pain.  Does not feel suspicious.

## 2017-12-14 NOTE — Assessment & Plan Note (Signed)
Depression significantly improved, anxiety somewhat improved.  Will continue current dose of Zoloft at 25 mg daily. She will get connected with therapy for further treatment of anxiety. She will follow up and update as needed. Denies SI/HI.

## 2018-01-29 DIAGNOSIS — F4322 Adjustment disorder with anxiety: Secondary | ICD-10-CM | POA: Diagnosis not present

## 2018-02-22 DIAGNOSIS — F4322 Adjustment disorder with anxiety: Secondary | ICD-10-CM | POA: Diagnosis not present

## 2018-05-15 ENCOUNTER — Ambulatory Visit: Payer: BLUE CROSS/BLUE SHIELD | Admitting: Primary Care

## 2018-08-14 ENCOUNTER — Other Ambulatory Visit (HOSPITAL_COMMUNITY)
Admission: RE | Admit: 2018-08-14 | Discharge: 2018-08-14 | Disposition: A | Discharge status: Home / Self Care | Payer: BC Managed Care – PPO | Source: Ambulatory Visit | Attending: Primary Care | Admitting: Primary Care

## 2018-08-14 ENCOUNTER — Other Ambulatory Visit: Payer: Self-pay

## 2018-08-14 ENCOUNTER — Encounter: Payer: Self-pay | Admitting: Primary Care

## 2018-08-14 ENCOUNTER — Ambulatory Visit (INDEPENDENT_AMBULATORY_CARE_PROVIDER_SITE_OTHER): Payer: BC Managed Care – PPO | Admitting: Primary Care

## 2018-08-14 VITALS — BP 148/96 | HR 83 | Temp 98.3°F | Ht 64.0 in | Wt 168.5 lb

## 2018-08-14 DIAGNOSIS — Z0000 Encounter for general adult medical examination without abnormal findings: Secondary | ICD-10-CM

## 2018-08-14 DIAGNOSIS — F4323 Adjustment disorder with mixed anxiety and depressed mood: Secondary | ICD-10-CM | POA: Diagnosis not present

## 2018-08-14 DIAGNOSIS — I10 Essential (primary) hypertension: Secondary | ICD-10-CM

## 2018-08-14 DIAGNOSIS — Z124 Encounter for screening for malignant neoplasm of cervix: Secondary | ICD-10-CM | POA: Insufficient documentation

## 2018-08-14 MED ORDER — HYDROCHLOROTHIAZIDE 25 MG PO TABS: 25.0000 mg | ORAL_TABLET | Freq: Every day | ORAL | 0 refills | Status: DC

## 2018-08-14 NOTE — Patient Instructions (Signed)
Start hydrochlorothiazide 25 mg tablets for blood pressure. Take 1 tablet once daily.  Start exercising. You should be getting 150 minutes of moderate intensity exercise weekly.  It's important to improve your diet by reducing consumption of fast food, fried food, processed snack foods, sugary drinks. Increase consumption of fresh vegetables and fruits, whole grains, water.  Ensure you are drinking 64 ounces of water daily.  Schedule a follow up visit for 2-3 weeks for blood pressure check and labs.  It was a pleasure to see you today!   Preventive Care 46-14 Years Old, Female Preventive care refers to visits with your health care provider and lifestyle choices that can promote health and wellness. This includes:  A yearly physical exam. This may also be called an annual well check.  Regular dental visits and eye exams.  Immunizations.  Screening for certain conditions.  Healthy lifestyle choices, such as eating a healthy diet, getting regular exercise, not using drugs or products that contain nicotine and tobacco, and limiting alcohol use. What can I expect for my preventive care visit? Physical exam Your health care provider will check your:  Height and weight. This may be used to calculate body mass index (BMI), which tells if you are at a healthy weight.  Heart rate and blood pressure.  Skin for abnormal spots. Counseling Your health care provider may ask you questions about your:  Alcohol, tobacco, and drug use.  Emotional well-being.  Home and relationship well-being.  Sexual activity.  Eating habits.  Work and work Statistician.  Method of birth control.  Menstrual cycle.  Pregnancy history. What immunizations do I need?  Influenza (flu) vaccine  This is recommended every year. Tetanus, diphtheria, and pertussis (Tdap) vaccine  You may need a Td booster every 10 years. Varicella (chickenpox) vaccine  You may need this if you have not been  vaccinated. Human papillomavirus (HPV) vaccine  If recommended by your health care provider, you may need three doses over 6 months. Measles, mumps, and rubella (MMR) vaccine  You may need at least one dose of MMR. You may also need a second dose. Meningococcal conjugate (MenACWY) vaccine  One dose is recommended if you are age 66-21 years and a first-year college student living in a residence hall, or if you have one of several medical conditions. You may also need additional booster doses. Pneumococcal conjugate (PCV13) vaccine  You may need this if you have certain conditions and were not previously vaccinated. Pneumococcal polysaccharide (PPSV23) vaccine  You may need one or two doses if you smoke cigarettes or if you have certain conditions. Hepatitis A vaccine  You may need this if you have certain conditions or if you travel or work in places where you may be exposed to hepatitis A. Hepatitis B vaccine  You may need this if you have certain conditions or if you travel or work in places where you may be exposed to hepatitis B. Haemophilus influenzae type b (Hib) vaccine  You may need this if you have certain conditions. You may receive vaccines as individual doses or as more than one vaccine together in one shot (combination vaccines). Talk with your health care provider about the risks and benefits of combination vaccines. What tests do I need?  Blood tests  Lipid and cholesterol levels. These may be checked every 5 years starting at age 80.  Hepatitis C test.  Hepatitis B test. Screening  Diabetes screening. This is done by checking your blood sugar (glucose) after you have  not eaten for a while (fasting).  Sexually transmitted disease (STD) testing.  BRCA-related cancer screening. This may be done if you have a family history of breast, ovarian, tubal, or peritoneal cancers.  Pelvic exam and Pap test. This may be done every 3 years starting at age 39. Starting at  age 90, this may be done every 5 years if you have a Pap test in combination with an HPV test. Talk with your health care provider about your test results, treatment options, and if necessary, the need for more tests. Follow these instructions at home: Eating and drinking   Eat a diet that includes fresh fruits and vegetables, whole grains, lean protein, and low-fat dairy.  Take vitamin and mineral supplements as recommended by your health care provider.  Do not drink alcohol if: ? Your health care provider tells you not to drink. ? You are pregnant, may be pregnant, or are planning to become pregnant.  If you drink alcohol: ? Limit how much you have to 0-1 drink a day. ? Be aware of how much alcohol is in your drink. In the [place] one drink equals one 12 oz bottle of beer (355 mL), one 5 oz glass of wine (148 mL), or one 1 oz glass of hard liquor (44 mL). Lifestyle  Take daily care of your teeth and gums.  Stay active. Exercise for at least 30 minutes on 5 or more days each week.  Do not use any products that contain nicotine or tobacco, such as cigarettes, e-cigarettes, and chewing tobacco. If you need help quitting, ask your health care provider.  If you are sexually active, practice safe sex. Use a condom or other form of birth control (contraception) in order to prevent pregnancy and STIs (sexually transmitted infections). If you plan to become pregnant, see your health care provider for a preconception visit. What's next?  Visit your health care provider once a year for a well check visit.  Ask your health care provider how often you should have your eyes and teeth checked.  Stay up to date on all vaccines. This information is not intended to replace advice given to you by your health care provider. Make sure you discuss any questions you have with your health care provider. Document Released: 02/15/2001 Document Revised: 08/31/2017 Document Reviewed: 08/31/2017 Elsevier  Patient Education  2020 [employer].

## 2018-08-14 NOTE — Assessment & Plan Note (Signed)
Doing better overall, has not taken Zoloft in months. Did well with therapy. Continue to monitor. Removed Zoloft from medication list.

## 2018-08-14 NOTE — Assessment & Plan Note (Addendum)
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 Given continued elevated BP readings we will treat.   Rx for HCTZ 25 mg sent to pharmacy. She is breastfeeding some and is working on a weaning schedule. She plans to stop within a few weeks. We will plan to see her back in 2 weeks for BP check and BMP.

## 2018-08-14 NOTE — Progress Notes (Signed)
Subjective:    Patient ID: Sabrina Mills, female    DOB: 1985-09-19, [age] y.o.   MRN: [id number]  HPI  Sabrina Mills is a 33 year old female who presents today for complete physical and treatment for hypertension.  Immunizations: -Tetanus: Completed in 2017 -Influenza: Due this season    Diet: She endorses a healthy diet. She eats mostly lean protein, vegetables, starch. Desserts once weekly. She is drinking water, coffee mostly, some soda, juice, and sweet tea.  Exercise: She is not exercising, some walking  Eye exam: Completed several years ago.  Dental exam: Completes semi-annually  Pap Smear: Due  BP Readings from Last 3 Encounters:  08/14/18 (!) 148/96  12/14/17 140/80  12/13/17 (!) 135/95   She has not checked her BP at home or elsewhere. She endorses headaches 3-4 days weekly. She denies dizziness, fatigue. History of gestational and post partum hypertension. She has a family history of hypertension in her father and paternal grandmother. She is not taking daily NSAID's.   Review of Systems  Constitutional: Negative for unexpected weight change.  HENT: Negative for rhinorrhea.   Respiratory: Negative for cough and shortness of breath.   Cardiovascular: Negative for chest pain.  Gastrointestinal: Negative for constipation and diarrhea.  Genitourinary: Negative for difficulty urinating.  Musculoskeletal: Negative for arthralgias and myalgias.  Skin: Negative for rash.  Allergic/Immunologic: Negative for environmental allergies.  Neurological: Positive for headaches. Negative for dizziness.  Psychiatric/Behavioral:       Doing well off of Zoloft       Past Medical History:  Diagnosis Date  . Asthma      Social History   Socioeconomic History  . Marital status: Married    Spouse name: Not on file  . Number of children: Not on file  . Years of education: Not on file  . Highest education level: Not on file  Occupational History  . Not on file  Social Needs   . Financial resource strain: Not on file  . Food insecurity    Worry: Not on file    Inability: Not on file  . Transportation needs    Medical: Not on file    Non-medical: Not on file  Tobacco Use  . Smoking status: Never Smoker  . Smokeless tobacco: Never Used  Substance and Sexual Activity  . Alcohol use: Yes    Alcohol/week: 0.0 standard drinks    Comment: Social   . Drug use: No  . Sexual activity: Not on file  Lifestyle  . Physical activity    Days per week: Not on file    Minutes per session: Not on file  . Stress: Not on file  Relationships  . Social Musicianconnections    Talks on phone: Not on file    Gets together: Not on file    Attends religious service: Not on file    Active member of club or organization: Not on file    Attends meetings of clubs or organizations: Not on file    Relationship status: Not on file  . Intimate partner violence    Fear of current or ex partner: Not on file    Emotionally abused: Not on file    Physically abused: Not on file    Forced sexual activity: Not on file  Other Topics Concern  . Not on file  Social History Narrative   Married.   Moved from [place].   Works at [employer].   No children.   Enjoys  traveling, cooking, spending time with family.    Past Surgical History:  Procedure Laterality Date  . CESAREAN SECTION N/A 05/01/2016   Procedure: CESAREAN SECTION;  Surgeon: Christophe Louis, MD;  Location: [place];  Service: Obstetrics;  Laterality: N/A;  . NO PAST SURGERIES      Family History  Problem Relation Age of Onset  . Hypertension Father     No Known Allergies  Current Outpatient Medications on File Prior to Visit  Medication Sig Dispense Refill  . etodolac (LODINE) 400 MG tablet Take 1 tablet (400 mg total) by mouth 2 (two) times daily. With food 30 tablet 0  . Prenatal Vit-Fe Fumarate-FA (PRENATAL MULTIVITAMIN) TABS tablet Take 1 tablet by mouth daily at 12 noon.     No current  facility-administered medications on file prior to visit.     BP (!) 148/96   Pulse 83   Temp 98.3 F (36.8 C) (Temporal)   Ht 5\' 4"  (1.626 m)   Wt 168 lb 8 oz (76.4 kg)   LMP 07/31/2018   SpO2 98%   BMI 28.92 kg/m    Objective:   Physical Exam  Constitutional: She is oriented to person, place, and time. She appears well-nourished.  HENT:  Mouth/Throat: No oropharyngeal exudate.  Eyes: Pupils are equal, round, and reactive to light. EOM are normal.  Neck: Neck supple. No thyromegaly present.  Cardiovascular: Normal rate and regular rhythm.  Respiratory: Effort normal and breath sounds normal. No breast tenderness or discharge.  GI: Soft. Bowel sounds are normal. There is no abdominal tenderness.  Genitourinary: There is no tenderness or lesion on the right labia. There is no tenderness or lesion on the left labia. Cervix exhibits discharge. Cervix exhibits no motion tenderness. Right adnexum displays no tenderness. Left adnexum displays no tenderness.    No vaginal discharge.     Genitourinary Comments: Scant amount of whitish vaginal discharge without foul smell   Musculoskeletal: Normal range of motion.  Neurological: She is alert and oriented to person, place, and time.  Skin: Skin is warm and dry.  Psychiatric: She has a normal mood and affect.           Assessment & Plan:

## 2018-08-14 NOTE — Assessment & Plan Note (Signed)
Immunizations UTD. Pap smear due, completed today. Encouraged regular exercise, healthy diet.  Exam unremarkable. Labs pending Follow up in 1 year for CPE.

## 2018-08-15 LAB — CYTOLOGY - PAP
Diagnosis: NEGATIVE
HPV: NOT DETECTED

## 2018-08-28 ENCOUNTER — Encounter: Payer: Self-pay | Admitting: Primary Care

## 2018-08-28 ENCOUNTER — Ambulatory Visit: Payer: BC Managed Care – PPO | Admitting: Primary Care

## 2018-08-28 ENCOUNTER — Other Ambulatory Visit: Payer: Self-pay

## 2018-08-28 VITALS — BP 132/86 | HR 80 | Temp 97.9°F | Ht 64.0 in | Wt 164.0 lb

## 2018-08-28 DIAGNOSIS — Z23 Encounter for immunization: Secondary | ICD-10-CM | POA: Diagnosis not present

## 2018-08-28 DIAGNOSIS — I10 Essential (primary) hypertension: Secondary | ICD-10-CM

## 2018-08-28 LAB — BASIC METABOLIC PANEL
BUN: 13 mg/dL (ref 6–23)
CO2: 27 mEq/L (ref 19–32)
Calcium: 9.4 mg/dL (ref 8.4–10.5)
Chloride: 99 mEq/L (ref 96–112)
Creatinine, Ser: 0.8 mg/dL (ref 0.40–1.20)
GFR: 100.85 mL/min (ref >60.00)
Glucose, Bld: 91 mg/dL (ref 70–99)
Potassium: 3.6 mEq/L (ref 3.5–5.1)
Sodium: 136 mEq/L (ref 135–145)

## 2018-08-28 MED ORDER — HYDROCHLOROTHIAZIDE 25 MG PO TABS: 25.0000 mg | ORAL_TABLET | Freq: Every day | ORAL | 3 refills | Status: DC

## 2018-08-28 NOTE — Progress Notes (Signed)
Subjective:    Patient ID: Sabrina Mills, female    DOB: May 23, 1985, 33 y.o.   MRN: [id number]  HPI  [name] is a 33 year old female who presents today for follow up of hypertension.  She was last evaluated on 08/14/2018 for CPE and blood pressure was noted to be above goal during prior visits and also had history of gestational and postpartum hypertension. Given elevated readings we initiated HCTZ 25 mg daily.  Since her last visit she's feeling better. She denies dizziness, headaches. She's not checking her BP at home.   BP Readings from Last 3 Encounters:  08/28/18 132/86  08/14/18 (!) 148/96  12/14/17 140/80     Review of Systems  Respiratory: Negative for shortness of breath.   Cardiovascular: Negative for chest pain.  Neurological: Negative for dizziness and headaches.       Past Medical History:  Diagnosis Date  . Asthma      Social History   Socioeconomic History  . Marital status: Married    Spouse name: Not on file  . Number of children: Not on file  . Years of education: Not on file  . Highest education level: Not on file  Occupational History  . Not on file  Social Needs  . Financial resource strain: Not on file  . Food insecurity    Worry: Not on file    Inability: Not on file  . Transportation needs    Medical: Not on file    Non-medical: Not on file  Tobacco Use  . Smoking status: Never Smoker  . Smokeless tobacco: Never Used  Substance and Sexual Activity  . Alcohol use: Yes    Alcohol/week: 0.0 standard drinks    Comment: Social   . Drug use: No  . Sexual activity: Not on file  Lifestyle  . Physical activity    Days per week: Not on file    Minutes per session: Not on file  . Stress: Not on file  Relationships  . Social Herbalist on phone: Not on file    Gets together: Not on file    Attends religious service: Not on file    Active member of club or organization: Not on file    Attends meetings of clubs or  organizations: Not on file    Relationship status: Not on file  . Intimate partner violence    Fear of current or ex partner: Not on file    Emotionally abused: Not on file    Physically abused: Not on file    Forced sexual activity: Not on file  Other Topics Concern  . Not on file  Social History Narrative   Married.   Moved from [place].   Works at [employer].   No children.   Enjoys traveling, cooking, spending time with family.    Past Surgical History:  Procedure Laterality Date  . CESAREAN SECTION N/A 05/01/2016   Procedure: CESAREAN SECTION;  Surgeon: Christophe Louis, MD;  Location: [facility];  Service: Obstetrics;  Laterality: N/A;  . NO PAST SURGERIES      Family History  Problem Relation Age of Onset  . Hypertension Father     No Known Allergies  Current Outpatient Medications on File Prior to Visit  Medication Sig Dispense Refill  . etodolac (LODINE) 400 MG tablet Take 1 tablet (400 mg total) by mouth 2 (two) times daily. With food 30 tablet 0  . hydrochlorothiazide (HYDRODIURIL) 25  MG tablet Take 1 tablet (25 mg total) by mouth daily. For blood pressure. 30 tablet 0  . Prenatal Vit-Fe Fumarate-FA (PRENATAL MULTIVITAMIN) TABS tablet Take 1 tablet by mouth daily at 12 noon.     No current facility-administered medications on file prior to visit.     BP 132/86   Pulse 80   Temp 97.9 F (36.6 C) (Temporal)   Ht 5\' 4"  (1.626 m)   Wt 164 lb (74.4 kg)   LMP 07/30/2018   SpO2 98%   BMI 28.15 kg/m    Objective:   Physical Exam  Constitutional: She appears well-nourished.  Neck: Neck supple.  Cardiovascular: Normal rate and regular rhythm.  Respiratory: Effort normal and breath sounds normal.  Skin: Skin is warm and dry.           Assessment & Plan:

## 2018-08-28 NOTE — Patient Instructions (Signed)
Continue hydrochlorothiazide 25 mg once daily for blood pressure.  Stop by the lab prior to leaving today. I will notify you of your results once received.   It was a pleasure to see you today!

## 2018-08-28 NOTE — Assessment & Plan Note (Signed)
Improved with addition of HCTZ 25 mg. Would like to see her lower but she is working on lifestyle changes so we will continue to monitor.  BMP pending. Continue HCTZ 25 mg.

## 2018-10-02 DIAGNOSIS — Z6828 Body mass index (BMI) 28.0-28.9, adult: Secondary | ICD-10-CM | POA: Diagnosis not present

## 2018-10-02 DIAGNOSIS — Z01419 Encounter for gynecological examination (general) (routine) without abnormal findings: Secondary | ICD-10-CM | POA: Diagnosis not present

## 2018-10-26 ENCOUNTER — Encounter: Payer: Self-pay | Admitting: Primary Care

## 2018-10-26 ENCOUNTER — Ambulatory Visit: Payer: BC Managed Care – PPO | Admitting: Primary Care

## 2018-10-26 ENCOUNTER — Ambulatory Visit (INDEPENDENT_AMBULATORY_CARE_PROVIDER_SITE_OTHER)
Admission: RE | Admit: 2018-10-26 | Discharge: 2018-10-26 | Disposition: A | Discharge status: Home / Self Care | Payer: BC Managed Care – PPO | Source: Ambulatory Visit | Attending: Primary Care | Admitting: Primary Care

## 2018-10-26 ENCOUNTER — Other Ambulatory Visit: Payer: Self-pay

## 2018-10-26 ENCOUNTER — Other Ambulatory Visit: Payer: Self-pay | Admitting: Primary Care

## 2018-10-26 VITALS — BP 118/74 | HR 73 | Temp 97.8°F | Ht 64.0 in | Wt 165.0 lb

## 2018-10-26 DIAGNOSIS — R202 Paresthesia of skin: Secondary | ICD-10-CM

## 2018-10-26 DIAGNOSIS — G8929 Other chronic pain: Secondary | ICD-10-CM | POA: Diagnosis not present

## 2018-10-26 DIAGNOSIS — I10 Essential (primary) hypertension: Secondary | ICD-10-CM

## 2018-10-26 DIAGNOSIS — M542 Cervicalgia: Secondary | ICD-10-CM

## 2018-10-26 DIAGNOSIS — R200 Anesthesia of skin: Secondary | ICD-10-CM

## 2018-10-26 LAB — CBC
HCT: 36.4 % (ref 36.0–46.0)
Hemoglobin: 12 g/dL (ref 12.0–15.0)
MCHC: 33 g/dL (ref 30.0–36.0)
MCV: 85.4 fl (ref 78.0–100.0)
Platelets: 307 10*3/uL (ref 150.0–400.0)
RBC: 4.26 Mil/uL (ref 3.87–5.11)
RDW: 14.3 % (ref 11.5–15.5)
WBC: 4.9 10*3/uL (ref 4.0–10.5)

## 2018-10-26 LAB — VITAMIN B12: Vitamin B-12: 271 pg/mL (ref 211–911)

## 2018-10-26 LAB — BASIC METABOLIC PANEL
BUN: 12 mg/dL (ref 6–23)
CO2: 27 mEq/L (ref 19–32)
Calcium: 9.2 mg/dL (ref 8.4–10.5)
Chloride: 105 mEq/L (ref 96–112)
Creatinine, Ser: 0.73 mg/dL (ref 0.40–1.20)
GFR: 111.97 mL/min (ref >60.00)
Glucose, Bld: 87 mg/dL (ref 70–99)
Potassium: 4.4 mEq/L (ref 3.5–5.1)
Sodium: 140 mEq/L (ref 135–145)

## 2018-10-26 MED ORDER — CYCLOBENZAPRINE HCL 5 MG PO TABS: 5.0000 mg | ORAL_TABLET | Freq: Two times a day (BID) | ORAL | 0 refills | Status: DC | PRN

## 2018-10-26 NOTE — Assessment & Plan Note (Signed)
Could be contributing to left upper extremity tingling, check plain films of cervical spine. Unremarkable exam.

## 2018-10-26 NOTE — Assessment & Plan Note (Signed)
Chronic and intermittent, exam today unremarkable. Start with plain films of cervical spine given neck pain. No obvious tendonitis to elbow given HPI and exam.  Check B12, CBC, BMP.  Consider sports medicine evaluation vs neurology for evaluation. Await results.

## 2018-10-26 NOTE — Patient Instructions (Signed)
Stop by the lab and xray prior to leaving today. I will notify you of your results once received.   It was a pleasure to see you today!   

## 2018-10-26 NOTE — Progress Notes (Signed)
Subjective:    Patient ID: Sabrina Mills, female    DOB: 1985-03-21, 33 y.o.   MRN: [id number]  HPI  Sabrina Mills is a 33 year old female with a history of hypertension who presents today with a chief complaint of numbness/tingling.   BP Readings from Last 3 Encounters:  10/26/18 118/74  08/28/18 132/86  08/14/18 (!) 148/96   Her symptoms are located to the anterior left upper extremity proximal and distal to antecubital fossa. Symptoms began three months ago. Also with chronic neck pain for years.  Her symptoms are intermittent, more noticeable last week and this week.   She denies extremity/elbow pain, shoulder pain, wrist pain, left extremity weakness. She does type a lot. She does have chronic upper neck pain intermittently with bilateral lower neck tension on occasion. She did get a massage yesterday, no improvement in tingling.  She's not taken anything OTC for her symptoms. She does pick up her child with her arms, mostly right side.   Review of Systems  Constitutional: Negative for fever.  Musculoskeletal: Positive for myalgias and neck pain. Negative for arthralgias and joint swelling.  Skin: Negative for color change.  Neurological: Positive for numbness. Negative for dizziness and weakness.       Past Medical History:  Diagnosis Date  . Asthma      Social History   Socioeconomic History  . Marital status: Married    Spouse name: Not on file  . Number of children: Not on file  . Years of education: Not on file  . Highest education level: Not on file  Occupational History  . Not on file  Social Needs  . Financial resource strain: Not on file  . Food insecurity    Worry: Not on file    Inability: Not on file  . Transportation needs    Medical: Not on file    Non-medical: Not on file  Tobacco Use  . Smoking status: Never Smoker  . Smokeless tobacco: Never Used  Substance and Sexual Activity  . Alcohol use: Yes    Alcohol/week: 0.0 standard drinks   Comment: Social   . Drug use: No  . Sexual activity: Not on file  Lifestyle  . Physical activity    Days per week: Not on file    Minutes per session: Not on file  . Stress: Not on file  Relationships  . Social Herbalist on phone: Not on file    Gets together: Not on file    Attends religious service: Not on file    Active member of club or organization: Not on file    Attends meetings of clubs or organizations: Not on file    Relationship status: Not on file  . Intimate partner violence    Fear of current or ex partner: Not on file    Emotionally abused: Not on file    Physically abused: Not on file    Forced sexual activity: Not on file  Other Topics Concern  . Not on file  Social History Narrative   Married.   Moved from [place].   Works at [employer].   No children.   Enjoys traveling, cooking, spending time with family.    Past Surgical History:  Procedure Laterality Date  . CESAREAN SECTION N/A 05/01/2016   Procedure: CESAREAN SECTION;  Surgeon: Christophe Louis, MD;  Location: [facility];  Service: Obstetrics;  Laterality: N/A;  . NO PAST SURGERIES  Family History  Problem Relation Age of Onset  . Hypertension Father     No Known Allergies  Current Outpatient Medications on File Prior to Visit  Medication Sig Dispense Refill  . etodolac (LODINE) 400 MG tablet Take 1 tablet (400 mg total) by mouth 2 (two) times daily. With food 30 tablet 0  . hydrochlorothiazide (HYDRODIURIL) 25 MG tablet Take 1 tablet (25 mg total) by mouth daily. For blood pressure. 90 tablet 3  . Prenatal Vit-Fe Fumarate-FA (PRENATAL MULTIVITAMIN) TABS tablet Take 1 tablet by mouth daily at 12 noon.     No current facility-administered medications on file prior to visit.     BP 118/74   Pulse 73   Temp 97.8 F (36.6 C) (Temporal)   Ht 5\' 4"  (1.626 m)   Wt 165 lb (74.8 kg)   LMP 10/20/2018   SpO2 98%   BMI 28.32 kg/m    Objective:   Physical  Exam  Neck: Normal range of motion. Muscular tenderness present. No spinous process tenderness present. Normal range of motion present.    Cardiovascular: Normal rate.  Musculoskeletal:     Left elbow: She exhibits normal range of motion and no swelling. No tenderness found.       Arms:     Comments: 5/5 strength to bilateral upper extremities           Assessment & Plan:

## 2018-11-07 ENCOUNTER — Other Ambulatory Visit: Payer: Self-pay

## 2018-11-07 DIAGNOSIS — Z20828 Contact with and (suspected) exposure to other viral communicable diseases: Secondary | ICD-10-CM | POA: Diagnosis not present

## 2018-11-07 DIAGNOSIS — Z7189 Other specified counseling: Secondary | ICD-10-CM | POA: Diagnosis not present

## 2018-11-07 DIAGNOSIS — Z20822 Contact with and (suspected) exposure to covid-19: Secondary | ICD-10-CM

## 2018-11-08 LAB — NOVEL CORONAVIRUS, NAA: SARS-CoV-2, NAA: NOT DETECTED

## 2018-12-24 DIAGNOSIS — O4690 Antepartum hemorrhage, unspecified, unspecified trimester: Secondary | ICD-10-CM | POA: Diagnosis not present

## 2018-12-24 DIAGNOSIS — M545 Low back pain: Secondary | ICD-10-CM | POA: Diagnosis not present

## 2019-01-24 ENCOUNTER — Ambulatory Visit (INDEPENDENT_AMBULATORY_CARE_PROVIDER_SITE_OTHER): Payer: BC Managed Care – PPO | Admitting: Primary Care

## 2019-01-24 ENCOUNTER — Encounter: Payer: Self-pay | Admitting: Primary Care

## 2019-01-24 ENCOUNTER — Other Ambulatory Visit: Payer: Self-pay

## 2019-01-24 VITALS — BP 118/86 | HR 80 | Temp 97.0°F | Ht 64.0 in | Wt 170.5 lb

## 2019-01-24 DIAGNOSIS — M545 Low back pain, unspecified: Secondary | ICD-10-CM

## 2019-01-24 DIAGNOSIS — K921 Melena: Secondary | ICD-10-CM | POA: Diagnosis not present

## 2019-01-24 LAB — POC URINALSYSI DIPSTICK (AUTOMATED)
Bilirubin, UA: NEGATIVE
Blood, UA: NEGATIVE
Glucose, UA: NEGATIVE
Ketones, UA: NEGATIVE
Leukocytes, UA: NEGATIVE
Nitrite, UA: NEGATIVE
Protein, UA: NEGATIVE
Spec Grav, UA: 1.015 (ref 1.010–1.025)
Urobilinogen, UA: 0.2 E.U./dL
pH, UA: 8 (ref 5.0–8.0)

## 2019-01-24 LAB — POC HEMOCCULT BLD/STL (OFFICE/1-CARD/DIAGNOSTIC): Fecal Occult Blood, POC: NEGATIVE

## 2019-01-24 NOTE — Assessment & Plan Note (Addendum)
Since late December 2020, little improvement with chiropractor evaluation.  Exam today representative of [facility] involvement. Referral placed to PT. Discussed use of OTC NSAID's, Tylenol, ice/heat. No alarm signs.  UA negative.

## 2019-01-24 NOTE — Progress Notes (Signed)
Subjective:    Patient ID: Sabrina Mills, female    DOB: Oct 08, 1985, 34 y.o.   MRN: [id number]  HPI  This visit occurred during the SARS-CoV-2 public health emergency.  Safety protocols were in place, including screening questions prior to the visit, additional usage of staff PPE, and extensive cleaning of exam room while observing appropriate contact time as indicated for disinfecting solutions.   Sabrina Mills is a 34 year old female with a history of hypertension, chronic neck pain, anxiety and depression who presents today with a chief complaint of back pain. She also reports "black stools".  Her pain is located to the bilateral lower back that began in late December 2020. She's been visiting with a chiropractor for three weeks with slight improvement but without resolve in pain. She describes her lower back pain as a constant ache that is mostly bothersome when sitting for prolonged periods of time, bending forward. On occasion she will noticed tingling to her hamstrings when sitting.   This week she noticed two days of intermittent bilateral lower side pain when urinating, felt like a "tinge". This has resolved. Last night she was having a bowel movement and noticed "black stool". She she denies use of Pepto bismol or iron ills. She denies abdominal pain, diarrhea, bright red blood in the stools, nausea, vomiting, dysuria, injury/trauma.  She visit her GYN in December and was told that she had trace Ketones in her urine. She was told to drink more water.   Review of Systems  Gastrointestinal: Negative for abdominal pain, blood in stool, constipation, diarrhea, nausea and vomiting.       "Black stool"  Genitourinary: Negative for dysuria, flank pain, frequency, hematuria and vaginal discharge.  Musculoskeletal: Positive for back pain.  Neurological: Positive for numbness. Negative for weakness.       Past Medical History:  Diagnosis Date  . Asthma      Social History    Socioeconomic History  . Marital status: Married    Spouse name: Not on file  . Number of children: Not on file  . Years of education: Not on file  . Highest education level: Not on file  Occupational History  . Not on file  Tobacco Use  . Smoking status: Never Smoker  . Smokeless tobacco: Never Used  Substance and Sexual Activity  . Alcohol use: Yes    Alcohol/week: 0.0 standard drinks    Comment: Social   . Drug use: No  . Sexual activity: Not on file  Other Topics Concern  . Not on file  Social History Narrative   Married.   Moved from [place].   Works at [employer].   No children.   Enjoys traveling, cooking, spending time with family.   Social Determinants of Health   Financial Resource Strain:   . Difficulty of Paying Living Expenses: Not on file  Food Insecurity:   . Worried About Charity fundraiser in the Last Year: Not on file  . Ran Out of Food in the Last Year: Not on file  Transportation Needs:   . Lack of Transportation (Medical): Not on file  . Lack of Transportation (Non-Medical): Not on file  Physical Activity:   . Days of Exercise per Week: Not on file  . Minutes of Exercise per Session: Not on file  Stress:   . Feeling of Stress : Not on file  Social Connections:   . Frequency of Communication with Friends and Family: Not on file  .  Frequency of Social Gatherings with Friends and Family: Not on file  . Attends Religious Services: Not on file  . Active Member of Clubs or Organizations: Not on file  . Attends [place] Meetings: Not on file  . Marital Status: Not on file  Intimate Partner Violence:   . Fear of Current or Ex-Partner: Not on file  . Emotionally Abused: Not on file  . Physically Abused: Not on file  . Sexually Abused: Not on file    Past Surgical History:  Procedure Laterality Date  . CESAREAN SECTION N/A 05/01/2016   Procedure: CESAREAN SECTION;  Surgeon: Gerald Leitz, MD;  Location: [facility];  Service: Obstetrics;  Laterality: N/A;  . NO PAST SURGERIES      Family History  Problem Relation Age of Onset  . Hypertension Father     No Known Allergies  No current outpatient medications on file prior to visit.   No current facility-administered medications on file prior to visit.    BP 118/86   Pulse 80   Temp (!) 97 F (36.1 C) (Temporal)   Ht 5\' 4"  (1.626 m)   Wt 170 lb 8 oz (77.3 kg)   LMP 01/11/2019   SpO2 98%   BMI 29.27 kg/m    Objective:   Physical Exam  Constitutional: She appears well-nourished.  Cardiovascular: Normal rate and regular rhythm.  Respiratory: Effort normal and breath sounds normal.  GI: Soft. Normal appearance and bowel sounds are normal. There is no abdominal tenderness.  Stool on rectal exam today did not appear black. Negative occult stool card today.  Genitourinary: Rectum:     Guaiac result negative.     No external hemorrhoid or internal hemorrhoid.   Musculoskeletal:     Cervical back: Neck supple.  Skin: Skin is warm and dry.  Psychiatric: She has a normal mood and affect.           Assessment & Plan:

## 2019-01-24 NOTE — Patient Instructions (Addendum)
You will be contacted regarding your referral to physical therapy.  Please let us know if you have not been contacted within two weeks.   Ensure you are consuming 64 ounces of water daily.  It was a pleasure to see you today!

## 2019-01-29 DIAGNOSIS — M5186 Other intervertebral disc disorders, lumbar region: Secondary | ICD-10-CM | POA: Diagnosis not present

## 2019-01-29 DIAGNOSIS — M545 Low back pain: Secondary | ICD-10-CM | POA: Diagnosis not present

## 2019-01-29 DIAGNOSIS — R202 Paresthesia of skin: Secondary | ICD-10-CM | POA: Diagnosis not present

## 2019-01-29 DIAGNOSIS — M5127 Other intervertebral disc displacement, lumbosacral region: Secondary | ICD-10-CM | POA: Diagnosis not present

## 2019-01-29 DIAGNOSIS — R1084 Generalized abdominal pain: Secondary | ICD-10-CM | POA: Diagnosis not present

## 2019-01-29 DIAGNOSIS — M549 Dorsalgia, unspecified: Secondary | ICD-10-CM | POA: Diagnosis not present

## 2019-01-31 DIAGNOSIS — M5416 Radiculopathy, lumbar region: Secondary | ICD-10-CM | POA: Diagnosis not present

## 2019-02-13 DIAGNOSIS — M545 Low back pain: Secondary | ICD-10-CM | POA: Diagnosis not present

## 2019-02-13 DIAGNOSIS — M5416 Radiculopathy, lumbar region: Secondary | ICD-10-CM | POA: Diagnosis not present

## 2019-02-14 DIAGNOSIS — M5416 Radiculopathy, lumbar region: Secondary | ICD-10-CM | POA: Diagnosis not present

## 2019-03-04 DIAGNOSIS — M5126 Other intervertebral disc displacement, lumbar region: Secondary | ICD-10-CM | POA: Diagnosis not present

## 2019-03-04 DIAGNOSIS — M5416 Radiculopathy, lumbar region: Secondary | ICD-10-CM | POA: Diagnosis not present

## 2019-03-18 ENCOUNTER — Telehealth: Payer: Self-pay | Admitting: Primary Care

## 2019-03-18 DIAGNOSIS — M545 Low back pain, unspecified: Secondary | ICD-10-CM

## 2019-03-18 NOTE — Telephone Encounter (Signed)
Back on February 1st you [placed a PT order for patient but she declined to go at that time. She called today requesting a New PT Referral and she would like to go to [facility]. Please place New PT order.

## 2019-03-18 NOTE — Telephone Encounter (Signed)
Noted, new referral placed.  

## 2019-03-22 DIAGNOSIS — S330XXD Traumatic rupture of lumbar intervertebral disc, subsequent encounter: Secondary | ICD-10-CM | POA: Diagnosis not present

## 2019-03-22 DIAGNOSIS — M5116 Intervertebral disc disorders with radiculopathy, lumbar region: Secondary | ICD-10-CM | POA: Diagnosis not present

## 2019-03-26 DIAGNOSIS — M5126 Other intervertebral disc displacement, lumbar region: Secondary | ICD-10-CM | POA: Diagnosis not present

## 2019-03-28 ENCOUNTER — Ambulatory Visit: Payer: BC Managed Care – PPO

## 2019-03-29 DIAGNOSIS — M5116 Intervertebral disc disorders with radiculopathy, lumbar region: Secondary | ICD-10-CM | POA: Diagnosis not present

## 2019-03-29 DIAGNOSIS — S330XXD Traumatic rupture of lumbar intervertebral disc, subsequent encounter: Secondary | ICD-10-CM | POA: Diagnosis not present

## 2019-04-04 ENCOUNTER — Ambulatory Visit: Payer: BC Managed Care – PPO | Attending: Internal Medicine

## 2019-04-04 DIAGNOSIS — M5116 Intervertebral disc disorders with radiculopathy, lumbar region: Secondary | ICD-10-CM | POA: Diagnosis not present

## 2019-04-04 DIAGNOSIS — Z23 Encounter for immunization: Secondary | ICD-10-CM

## 2019-04-04 DIAGNOSIS — S330XXD Traumatic rupture of lumbar intervertebral disc, subsequent encounter: Secondary | ICD-10-CM | POA: Diagnosis not present

## 2019-04-04 NOTE — Progress Notes (Signed)
   Covid-19 Vaccination Clinic  Name:  Lanique Gonzalo    MRN: [id number] DOB: [birth year]  04/04/2019  Ms. Delira was observed post Covid-19 immunization for 15 minutes without incident. She was provided with Vaccine Information Sheet and instruction to access the V-Safe system.   Ms. Counts was instructed to call 911 with any severe reactions post vaccine: Marland Kitchen Difficulty breathing  . Swelling of face and throat  . A fast heartbeat  . A bad rash all over body  . Dizziness and weakness   Immunizations Administered    Name Date Dose VIS Date Route   Pfizer COVID-19 Vaccine 04/04/2019  9:44 AM 0.3 mL 12/14/2018 Intramuscular   Manufacturer: [employer], [employer]   Lot: [id number]   NDC: [address]-5

## 2019-04-15 DIAGNOSIS — M5116 Intervertebral disc disorders with radiculopathy, lumbar region: Secondary | ICD-10-CM | POA: Diagnosis not present

## 2019-04-15 DIAGNOSIS — S330XXD Traumatic rupture of lumbar intervertebral disc, subsequent encounter: Secondary | ICD-10-CM | POA: Diagnosis not present

## 2019-04-29 ENCOUNTER — Ambulatory Visit: Payer: BC Managed Care – PPO | Attending: Internal Medicine

## 2019-04-29 DIAGNOSIS — Z23 Encounter for immunization: Secondary | ICD-10-CM

## 2019-04-29 NOTE — Progress Notes (Signed)
   Covid-19 Vaccination Clinic  Name:  Sabrina Mills    MRN: [id number] DOB: 07-Nov-1985  04/29/2019  Sabrina Mills was observed post Covid-19 immunization for 15 minutes without incident. She was provided with Vaccine Information Sheet and instruction to access the V-Safe system.   Sabrina Mills was instructed to call 911 with any severe reactions post vaccine: Marland Kitchen Difficulty breathing  . Swelling of face and throat  . A fast heartbeat  . A bad rash all over body  . Dizziness and weakness   Immunizations Administered    Name Date Dose VIS Date Route   Pfizer COVID-19 Vaccine 04/29/2019  9:34 AM 0.3 mL 02/27/2018 Intramuscular   Manufacturer: [employer], [employer]   Lot: [id number]   NDC: [address]-9

## 2019-06-12 ENCOUNTER — Other Ambulatory Visit: Payer: Self-pay

## 2019-06-12 ENCOUNTER — Ambulatory Visit (INDEPENDENT_AMBULATORY_CARE_PROVIDER_SITE_OTHER): Payer: 59 | Admitting: Primary Care

## 2019-06-12 ENCOUNTER — Encounter: Payer: Self-pay | Admitting: Primary Care

## 2019-06-12 DIAGNOSIS — L03311 Cellulitis of abdominal wall: Secondary | ICD-10-CM | POA: Diagnosis not present

## 2019-06-12 DIAGNOSIS — L0390 Cellulitis, unspecified: Secondary | ICD-10-CM | POA: Insufficient documentation

## 2019-06-12 NOTE — Assessment & Plan Note (Signed)
Acute for the last six days after insect bite, treated three days ago at Urgent Care.  Today she appears to be improving based off of HPI and presentation. Site is non fluctuant, not a candidate for I&D.   Continue Doxycycline. Return precautions provided.

## 2019-06-12 NOTE — Progress Notes (Signed)
Subjective:    Patient ID: Sabrina Mills, female    DOB: 11-24-1985, 34 y.o.   MRN: [id number]  HPI  This visit occurred during the SARS-CoV-2 public health emergency.  Safety protocols were in place, including screening questions prior to the visit, additional usage of staff PPE, and extensive cleaning of exam room while observing appropriate contact time as indicated for disinfecting solutions.   Sabrina Mills is a 34 year old female who presents today with a chief complaint of skin infection.  She was bitten by some form of insect six days ago to the right upper abdomen and left lower abdomen. Several days later she noticed a significant amount of swelling, erythema, and pain to the bite on the left lower abdomen.   She sought out treatment at Urgent care three days ago given symptoms, was placed on Doxycycline 100 mg BID for a ten day course, has taken nearly three days of doses.   Since treatment at urgent care she's noticed a significant amount of improvement in pain, swelling, and erythema. She denies fevers.   Review of Systems  Constitutional: Negative for fever.  Skin: Positive for color change and wound.       Past Medical History:  Diagnosis Date  . Asthma      Social History   Socioeconomic History  . Marital status: Married    Spouse name: Not on file  . Number of children: Not on file  . Years of education: Not on file  . Highest education level: Not on file  Occupational History  . Not on file  Tobacco Use  . Smoking status: Never Smoker  . Smokeless tobacco: Never Used  Substance and Sexual Activity  . Alcohol use: Yes    Alcohol/week: 0.0 standard drinks    Comment: Social   . Drug use: No  . Sexual activity: Not on file  Other Topics Concern  . Not on file  Social History Narrative   Married.   Moved from [place].   Works at [employer].   No children.   Enjoys traveling, cooking, spending time with family.   Social  Determinants of Health   Financial Resource Strain:   . Difficulty of Paying Living Expenses:   Food Insecurity:   . Worried About Programme researcher, broadcasting/film/video in the Last Year:   . Barista in the Last Year:   Transportation Needs:   . Freight forwarder (Medical):   Marland Kitchen Lack of Transportation (Non-Medical):   Physical Activity:   . Days of Exercise per Week:   . Minutes of Exercise per Session:   Stress:   . Feeling of Stress :   Social Connections:   . Frequency of Communication with Friends and Family:   . Frequency of Social Gatherings with Friends and Family:   . Attends Religious Services:   . Active Member of Clubs or Organizations:   . Attends Banker Meetings:   Marland Kitchen Marital Status:   Intimate Partner Violence:   . Fear of Current or Ex-Partner:   . Emotionally Abused:   Marland Kitchen Physically Abused:   . Sexually Abused:     Past Surgical History:  Procedure Laterality Date  . CESAREAN SECTION N/A 05/01/2016   Procedure: CESAREAN SECTION;  Surgeon: Gerald Leitz, MD;  Location: [facility];  Service: Obstetrics;  Laterality: N/A;  . NO PAST SURGERIES      Family History  Problem Relation Age of Onset  .  Hypertension Father     No Known Allergies  Current Outpatient Medications on File Prior to Visit  Medication Sig Dispense Refill  . doxycycline (VIBRAMYCIN) 100 MG capsule Take 100 mg by mouth 2 (two) times daily.     No current facility-administered medications on file prior to visit.    BP 118/72   Pulse 100   Temp (!) 96 F (35.6 C) (Temporal)   Ht 5\' 4"  (1.626 m)   Wt 167 lb 12 oz (76.1 kg)   LMP 05/21/2019   SpO2 98%   BMI 28.79 kg/m    Objective:   Physical Exam  Constitutional: She appears well-nourished.  Cardiovascular: Normal rate and regular rhythm.  Respiratory: Effort normal and breath sounds normal.  Skin: Skin is warm and dry. There is erythema.  Entry bite site to left lower abdomen with mild erythema to and around site.  Site is firm with an area of 2X3 cm in size abscess superficially. No drainage.            Assessment & Plan:

## 2019-06-12 NOTE — Patient Instructions (Signed)
Continue taking Doxycycline twice daily until finished.  Please notify me if you do not see resolve in the site within one week after you finish the antibiotics.  It was a pleasure to see you today!

## 2019-10-29 ENCOUNTER — Other Ambulatory Visit: Payer: Self-pay | Admitting: Obstetrics and Gynecology

## 2019-10-29 DIAGNOSIS — N6452 Nipple discharge: Secondary | ICD-10-CM

## 2019-11-22 ENCOUNTER — Other Ambulatory Visit: Payer: 59

## 2019-12-18 ENCOUNTER — Other Ambulatory Visit: Payer: 59

## 2020-06-06 IMAGING — DX DG CERVICAL SPINE 2 OR 3 VIEWS
4 series · 5 of 5 positions shown · non-contrast
Comparison: None.

CLINICAL DATA: Chronic cervicalgia with left upper extremity
numbness

EXAM:
CERVICAL SPINE - 2-3 VIEW

[c-spine lat]
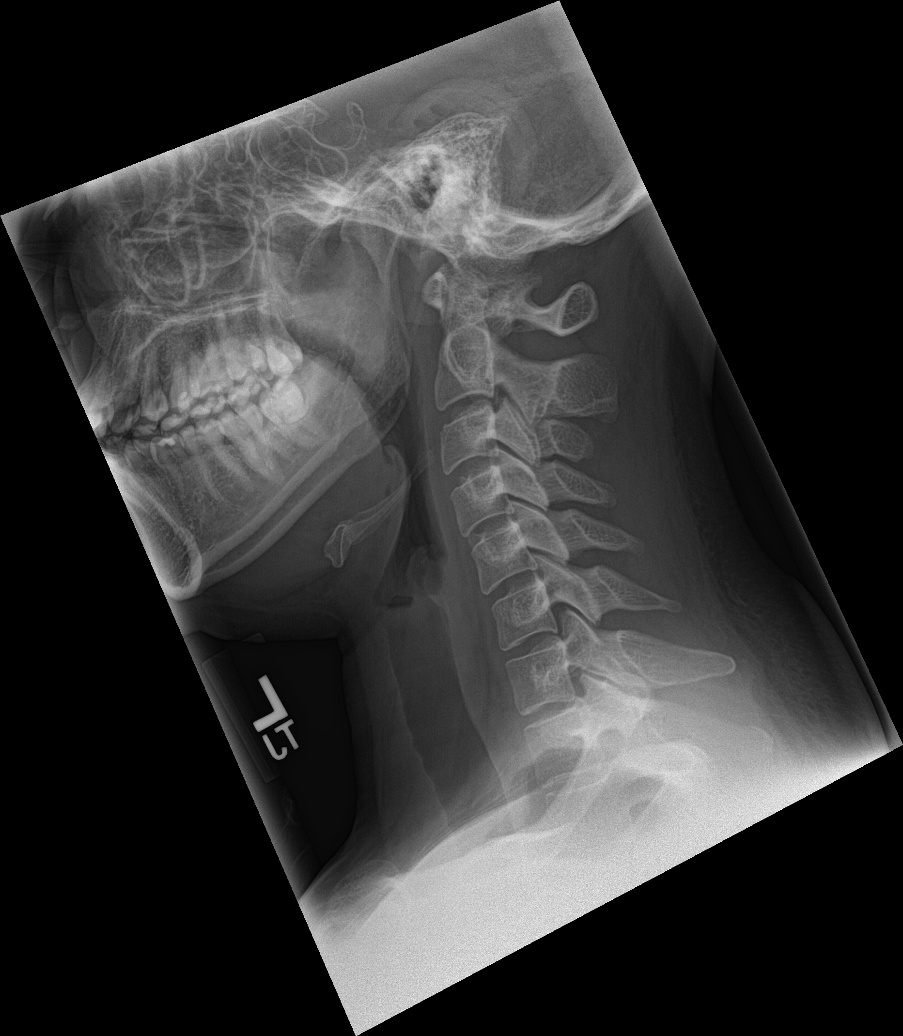

[c-spine ap]
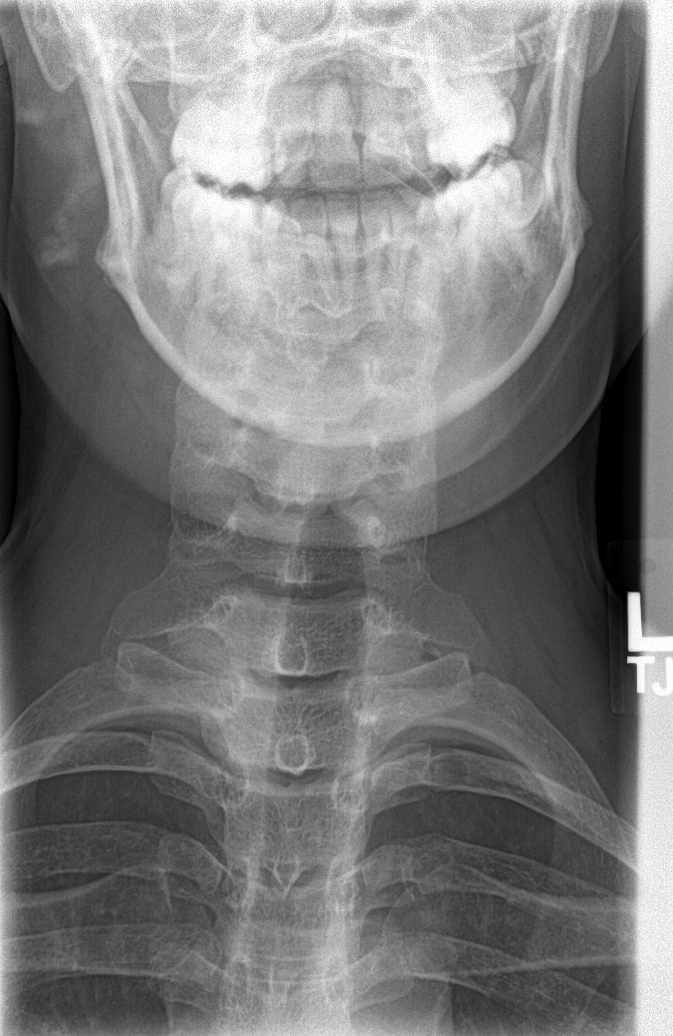

[Series 3: c-spine open mouth · 0.14mm/px · 2 of 2 slices shown]
[im 1/2]
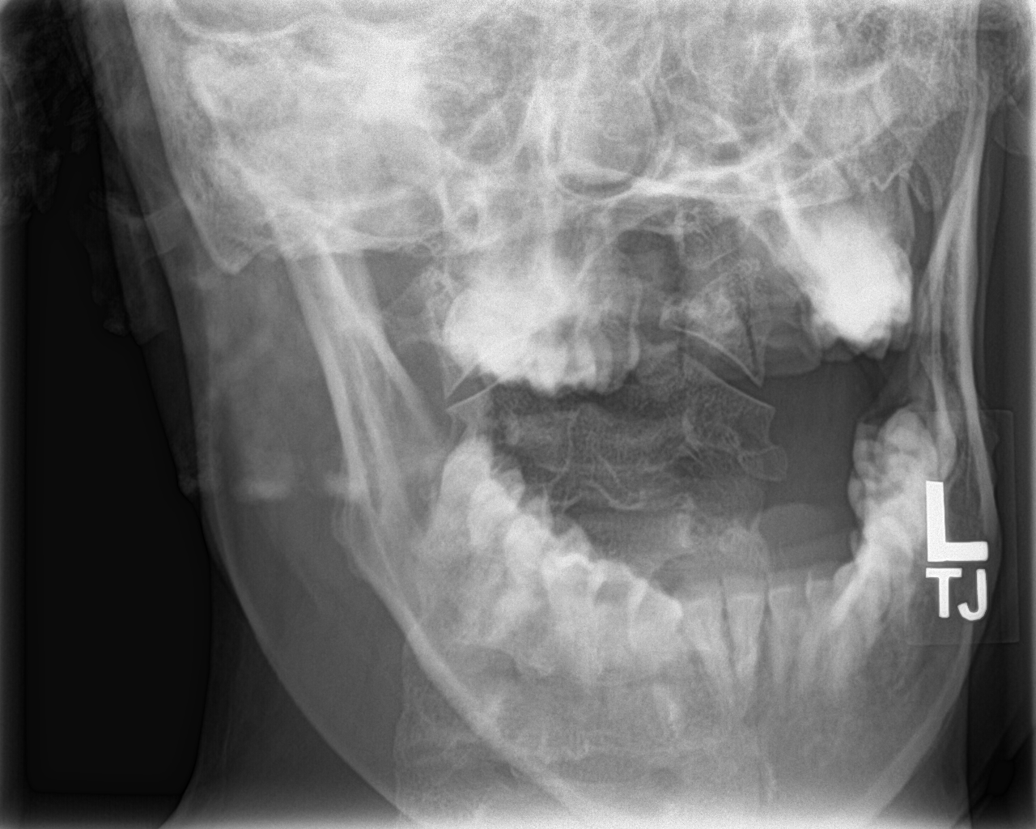
[im 2/2]
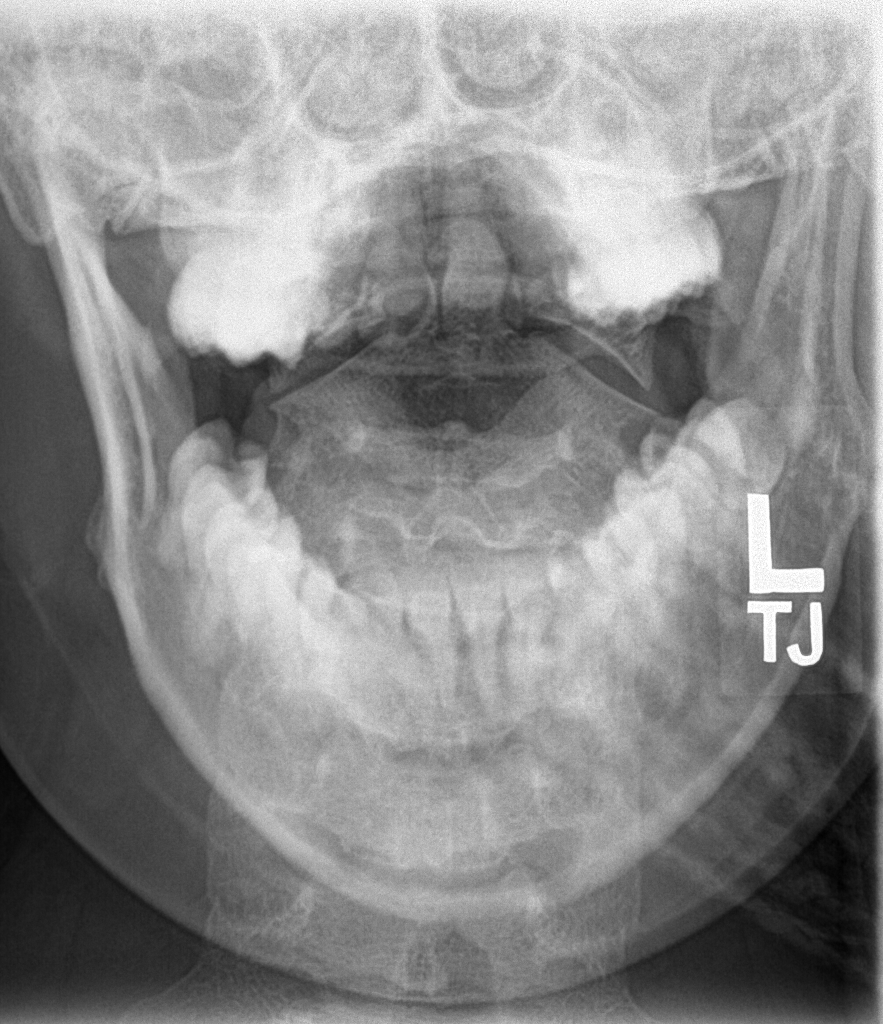

[c-spine swimmers]
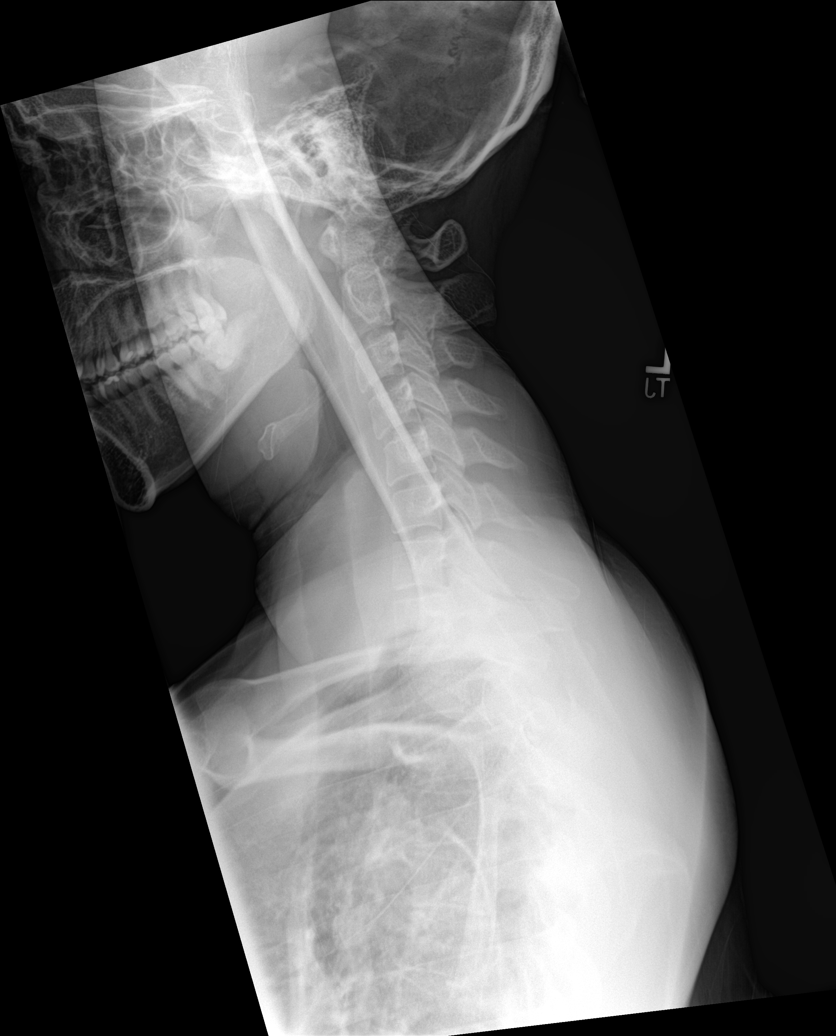

[5 of 5 positions shown; findings below may reference images not displayed]

FINDINGS: Frontal, lateral, and open-mouth odontoid images were obtained.
There is no fracture or spondylolisthesis. Prevertebral soft tissues
and predental space regions are normal. The disc spaces appear
unremarkable. No appreciable arthropathy.

There is relative lack of lordosis. There are rudimentary cervical
ribs bilaterally. Lung apices are clear.
IMPRESSION: Relative lack of lordosis may be indicative of a degree of muscle
spasm. No fracture or spondylolisthesis. No appreciable arthropathy.
Rudimentary cervical ribs noted bilaterally.

## 2020-09-23 ENCOUNTER — Encounter: Payer: 59 | Admitting: Primary Care
